# Patient Record
Sex: Male | Born: 1955
Health system: Southern US, Community
[De-identification: ages and names within clinical notes are randomized; demographics above are authoritative.]

## PROBLEM LIST (undated history)

## (undated) DIAGNOSIS — E119 Type 2 diabetes mellitus without complications: Secondary | ICD-10-CM

## (undated) DIAGNOSIS — Z889 Allergy status to unspecified drugs, medicaments and biological substances status: Secondary | ICD-10-CM

## (undated) DIAGNOSIS — I2510 Atherosclerotic heart disease of native coronary artery without angina pectoris: Secondary | ICD-10-CM

## (undated) DIAGNOSIS — E785 Hyperlipidemia, unspecified: Secondary | ICD-10-CM

## (undated) HISTORY — PX: KNEE SURGERY: SHX244

## (undated) HISTORY — PX: CORONARY ANGIOPLASTY WITH STENT PLACEMENT: SHX49

---

## 2008-06-26 ENCOUNTER — Ambulatory Visit: Payer: Self-pay | Admitting: Radiology

## 2008-06-26 ENCOUNTER — Ambulatory Visit (HOSPITAL_BASED_OUTPATIENT_CLINIC_OR_DEPARTMENT_OTHER): Admission: RE | Admit: 2008-06-26 | Discharge: 2008-06-26 | Payer: Self-pay | Admitting: Family Medicine

## 2012-10-02 ENCOUNTER — Encounter (HOSPITAL_BASED_OUTPATIENT_CLINIC_OR_DEPARTMENT_OTHER): Payer: Self-pay | Admitting: Emergency Medicine

## 2012-10-02 ENCOUNTER — Emergency Department (HOSPITAL_BASED_OUTPATIENT_CLINIC_OR_DEPARTMENT_OTHER)
Admission: EM | Admit: 2012-10-02 | Discharge: 2012-10-02 | Disposition: A | Discharge status: Home / Self Care | Payer: 59 | Attending: Emergency Medicine | Admitting: Emergency Medicine

## 2012-10-02 DIAGNOSIS — R220 Localized swelling, mass and lump, head: Secondary | ICD-10-CM | POA: Insufficient documentation

## 2012-10-02 DIAGNOSIS — R21 Rash and other nonspecific skin eruption: Secondary | ICD-10-CM | POA: Insufficient documentation

## 2012-10-02 DIAGNOSIS — R221 Localized swelling, mass and lump, neck: Secondary | ICD-10-CM | POA: Insufficient documentation

## 2012-10-02 DIAGNOSIS — Z87891 Personal history of nicotine dependence: Secondary | ICD-10-CM | POA: Insufficient documentation

## 2012-10-02 DIAGNOSIS — L509 Urticaria, unspecified: Secondary | ICD-10-CM | POA: Insufficient documentation

## 2012-10-02 DIAGNOSIS — T7840XA Allergy, unspecified, initial encounter: Secondary | ICD-10-CM

## 2012-10-02 DIAGNOSIS — Z79899 Other long term (current) drug therapy: Secondary | ICD-10-CM | POA: Insufficient documentation

## 2012-10-02 DIAGNOSIS — Z9861 Coronary angioplasty status: Secondary | ICD-10-CM | POA: Insufficient documentation

## 2012-10-02 HISTORY — DX: Allergy status to unspecified drugs, medicaments and biological substances: Z88.9

## 2012-10-02 MED ORDER — FAMOTIDINE IN NACL 20-0.9 MG/50ML-% IV SOLN
20.0000 mg | Freq: Once | INTRAVENOUS | Status: AC
  Administered 2012-10-02: 20 mg via INTRAVENOUS
  Filled 2012-10-02: qty 50

## 2012-10-02 MED ORDER — SODIUM CHLORIDE 0.9 % IV SOLN
INTRAVENOUS | Status: DC
  Administered 2012-10-02: 20:00:00 via INTRAVENOUS

## 2012-10-02 MED ORDER — FAMOTIDINE 20 MG PO TABS: 20.0000 mg | ORAL_TABLET | Freq: Two times a day (BID) | ORAL | Status: DC

## 2012-10-02 MED ORDER — DIPHENHYDRAMINE HCL 50 MG/ML IJ SOLN
50.0000 mg | Freq: Once | INTRAMUSCULAR | Status: AC
  Administered 2012-10-02: 50 mg via INTRAVENOUS
  Filled 2012-10-02: qty 1

## 2012-10-02 MED ORDER — METHYLPREDNISOLONE SODIUM SUCC 125 MG IJ SOLR
125.0000 mg | Freq: Once | INTRAMUSCULAR | Status: AC
  Administered 2012-10-02: 125 mg via INTRAVENOUS
  Filled 2012-10-02: qty 2

## 2012-10-02 MED ORDER — PREDNISONE 10 MG PO TABS: 20.0000 mg | ORAL_TABLET | Freq: Every day | ORAL | Status: DC

## 2012-10-02 NOTE — ED Notes (Signed)
MD at bedside. 

## 2012-10-02 NOTE — ED Notes (Signed)
Pt has daily problems with an unknown allergy and hives.  Today the swelling involves his mouth and tongue.  Sts allergist told him if it ever started to involve his mouth to come to ED.  Takes Benadryl and Zyrtec normally for sx but did not take it today. Has not taken any meds for allergies today.

## 2012-10-02 NOTE — ED Provider Notes (Signed)
CSN: [id number]     Arrival date & time 10/02/12  1853 History    This chart was scribed for Toy Baker, MD by Quintella Reichert, ED scribe.  This patient was seen in room MH01/MH01 and the patient's care was started at 7:33 PM.     Chief Complaint  Patient presents with  . Allergic Reaction    The history is provided by the patient. No language interpreter was used.    HPI Comments: Jeremiah Casey is a 57 y.o. male with chronic allergy problems of unknown etiology who presents to the Emergency Department complaining of moderate lip and tongue swelling.  Pt states that he has daily issues with generalized hives but has also had some mild swelling to his lips and tongue intermittently for several months.  Today he again developed lip and tongue swelling that has been more severe.  He also states that the back of his throat feel "funny."  He denies drooling, difficulty swallowing, wheezing, or SOB.  Pt is being evaluated by an allergist who advised him to come to the ED if his allergies spread to his mouth.  He normally medicates with Zyrtec and Benadryl but has not taken any today.  He also medicates regularly with Coreg.   Past Medical History  Diagnosis Date  . Multiple allergies     Past Surgical History  Procedure Laterality Date  . Coronary angioplasty with stent placement    . Knee surgery      No family history on file.   History  Substance Use Topics  . Smoking status: Former Games developer  . Smokeless tobacco: Not on file  . Alcohol Use: No     Review of Systems  HENT: Positive for facial swelling. Negative for drooling and trouble swallowing.   Respiratory: Negative for shortness of breath and wheezing.   Skin: Positive for rash.  All other systems reviewed and are negative.      Allergies  Review of patient's allergies indicates no known allergies.  Home Medications   Current Outpatient Rx  Name  Route  Sig  Dispense  Refill  . carvedilol (COREG) 12.5 MG  tablet   Oral   Take 12.5 mg by mouth 2 (two) times daily with a meal.          BP 132/82  Pulse 72  Temp(Src) 98.8 F (37.1 C) (Oral)  Resp 16  Ht 6\' 2"  (1.88 m)  Wt 235 lb (106.595 kg)  BMI 30.16 kg/m2  SpO2 100%  Physical Exam  Nursing note and vitals reviewed. Constitutional: He is oriented to person, place, and time. He appears well-developed and well-nourished.  Non-toxic appearance. No distress.  HENT:  Head: Normocephalic and atraumatic.  Mouth/Throat: Uvula is midline, oropharynx is clear and moist and mucous membranes are normal. No edematous. No oropharyngeal exudate, posterior oropharyngeal edema or posterior oropharyngeal erythema.  Bilateral lip edema, worse at the left lower lip.  No tongue edema. No uvula edema. Trachea midline.  Eyes: Conjunctivae, EOM and lids are normal. Pupils are equal, round, and reactive to light.  Neck: Normal range of motion. Neck supple. No tracheal deviation present. No mass present.  Cardiovascular: Normal rate, regular rhythm and normal heart sounds.  Exam reveals no gallop.   No murmur heard. Pulmonary/Chest: Effort normal and breath sounds normal. No stridor. No respiratory distress. He has no decreased breath sounds. He has no wheezes. He has no rhonchi. He has no rales.  Abdominal: Soft. Normal appearance and bowel sounds  are normal. He exhibits no distension. There is no tenderness. There is no rebound and no CVA tenderness.  Musculoskeletal: Normal range of motion. He exhibits no edema and no tenderness.  Neurological: He is alert and oriented to person, place, and time. He has normal strength. No cranial nerve deficit or sensory deficit. GCS eye subscore is 4. GCS verbal subscore is 5. GCS motor subscore is 6.  Skin: Skin is warm and dry. Rash noted. No abrasion noted.  Diffuse hives.  Psychiatric: He has a normal mood and affect. His speech is normal and behavior is normal.    ED Course  Procedures (including critical care  time)  DIAGNOSTIC STUDIES: Oxygen Saturation is 100% on room air, normal by my interpretation.    COORDINATION OF CARE: 7:38 PM-Discussed treatment plan which includes steroid treatment with pt at bedside and pt agreed to plan.    Labs Reviewed - No data to display No results found. No diagnosis found.  MDM  Patient given medications for allergic reaction here. The swelling has greatly improved. His known history of his type of reactions. No evidence of stridor. His airway is intact. Prescribe him corticosteroids and he was instructed to take Benadryl when necessary    I personally performed the services described in this documentation, which was scribed in my presence. The recorded information has been reviewed and is accurate.     Toy Baker, MD 10/02/12 [phone]

## 2014-06-03 ENCOUNTER — Emergency Department (HOSPITAL_BASED_OUTPATIENT_CLINIC_OR_DEPARTMENT_OTHER)
Admission: EM | Admit: 2014-06-03 | Discharge: 2014-06-03 | Disposition: A | Discharge status: Home / Self Care | Payer: 59 | Attending: Emergency Medicine | Admitting: Emergency Medicine

## 2014-06-03 ENCOUNTER — Encounter (HOSPITAL_BASED_OUTPATIENT_CLINIC_OR_DEPARTMENT_OTHER): Payer: Self-pay | Admitting: *Deleted

## 2014-06-03 DIAGNOSIS — S0502XA Injury of conjunctiva and corneal abrasion without foreign body, left eye, initial encounter: Secondary | ICD-10-CM | POA: Insufficient documentation

## 2014-06-03 DIAGNOSIS — Z87891 Personal history of nicotine dependence: Secondary | ICD-10-CM | POA: Insufficient documentation

## 2014-06-03 DIAGNOSIS — Y939 Activity, unspecified: Secondary | ICD-10-CM | POA: Diagnosis not present

## 2014-06-03 DIAGNOSIS — I2510 Atherosclerotic heart disease of native coronary artery without angina pectoris: Secondary | ICD-10-CM | POA: Diagnosis not present

## 2014-06-03 DIAGNOSIS — Z79899 Other long term (current) drug therapy: Secondary | ICD-10-CM | POA: Diagnosis not present

## 2014-06-03 DIAGNOSIS — Z9861 Coronary angioplasty status: Secondary | ICD-10-CM | POA: Diagnosis not present

## 2014-06-03 DIAGNOSIS — Y929 Unspecified place or not applicable: Secondary | ICD-10-CM | POA: Insufficient documentation

## 2014-06-03 DIAGNOSIS — S0592XA Unspecified injury of left eye and orbit, initial encounter: Secondary | ICD-10-CM | POA: Diagnosis present

## 2014-06-03 DIAGNOSIS — Y998 Other external cause status: Secondary | ICD-10-CM | POA: Diagnosis not present

## 2014-06-03 DIAGNOSIS — Z7982 Long term (current) use of aspirin: Secondary | ICD-10-CM | POA: Insufficient documentation

## 2014-06-03 DIAGNOSIS — X58XXXA Exposure to other specified factors, initial encounter: Secondary | ICD-10-CM | POA: Diagnosis not present

## 2014-06-03 HISTORY — DX: Atherosclerotic heart disease of native coronary artery without angina pectoris: I25.10

## 2014-06-03 MED ORDER — GENTAMICIN SULFATE 0.3 % OP SOLN: 2.0000 [drp] | Freq: Four times a day (QID) | OPHTHALMIC | Status: DC

## 2014-06-03 MED ORDER — FLUORESCEIN SODIUM 1 MG OP STRP
1.0000 | ORAL_STRIP | Freq: Once | OPHTHALMIC | Status: AC
  Administered 2014-06-03: 1 via OPHTHALMIC
  Filled 2014-06-03: qty 1

## 2014-06-03 MED ORDER — TETRACAINE HCL 0.5 % OP SOLN
2.0000 [drp] | Freq: Once | OPHTHALMIC | Status: AC
  Administered 2014-06-03: 2 [drp] via OPHTHALMIC
  Filled 2014-06-03: qty 2

## 2014-06-03 NOTE — ED Notes (Signed)
Pt denies any injury to Lt eye

## 2014-06-03 NOTE — ED Notes (Signed)
Pt ambulating independently w/ steady gait on d/c in no acute distress, A&Ox4. D/c instructions reviewed w/ pt and family - pt and family deny any further questions or concerns at present. Rx given x1  

## 2014-06-03 NOTE — Discharge Instructions (Signed)
Gentamicin eyedrops: 2 drops every 4 hours while awake.  Ibuprofen 600 mg every 6 hours as needed for pain.  Return to the emergency department if your symptoms significantly worsen or do not begin to improve in the next 24-48 hours.   Corneal Abrasion The cornea is the clear covering at the front and center of the eye. When looking at the colored portion of the eye (iris), you are looking through the cornea. This very thin tissue is made up of many layers. The surface layer is a single layer of cells (corneal epithelium) and is one of the most sensitive tissues in the body. If a scratch or injury causes the corneal epithelium to come off, it is called a corneal abrasion. If the injury extends to the tissues below the epithelium, the condition is called a corneal ulcer. CAUSES   Scratches.  Trauma.  Foreign body in the eye. Some people have recurrences of abrasions in the area of the original injury even after it has healed (recurrent erosion syndrome). Recurrent erosion syndrome generally improves and goes away with time. SYMPTOMS   Eye pain.  Difficulty or inability to keep the injured eye open.  The eye becomes very sensitive to light.  Recurrent erosions tend to happen suddenly, first thing in the morning, usually after waking up and opening the eye. DIAGNOSIS  Your health care provider can diagnose a corneal abrasion during an eye exam. Dye is usually placed in the eye using a drop or a small paper strip moistened by your tears. When the eye is examined with a special light, the abrasion shows up clearly because of the dye. TREATMENT   Small abrasions may be treated with antibiotic drops or ointment alone.  A pressure patch may be put over the eye. If this is done, follow your doctor's instructions for when to remove the patch. Do not drive or use machines while the eye patch is on. Judging distances is hard to do with a patch on. If the abrasion becomes infected and spreads to  the deeper tissues of the cornea, a corneal ulcer can result. This is serious because it can cause corneal scarring. Corneal scars interfere with light passing through the cornea and cause a loss of vision in the involved eye. HOME CARE INSTRUCTIONS  Use medicine or ointment as directed. Only take over-the-counter or prescription medicines for pain, discomfort, or fever as directed by your health care provider.  Do not drive or operate machinery if your eye is patched. Your ability to judge distances is impaired.  If your health care provider has given you a follow-up appointment, it is very important to keep that appointment. Not keeping the appointment could result in a severe eye infection or permanent loss of vision. If there is any problem keeping the appointment, let your health care provider know. SEEK MEDICAL CARE IF:   You have pain, light sensitivity, and a scratchy feeling in one eye or both eyes.  Your pressure patch keeps loosening up, and you can blink your eye under the patch after treatment.  Any kind of discharge develops from the eye after treatment or if the lids stick together in the morning.  You have the same symptoms in the morning as you did with the original abrasion days, weeks, or months after the abrasion healed. MAKE SURE YOU:   Understand these instructions.  Will watch your condition.  Will get help right away if you are not doing well or get worse. Document Released: 02/07/2000  Document Revised: 02/14/2013 Document Reviewed: 10/17/2012 [facility] Patient Information 2015 [place], [place]. This information is not intended to replace advice given to you by your health care provider. Make sure you discuss any questions you have with your health care provider.

## 2014-06-03 NOTE — ED Notes (Signed)
Pt reports fb sensation left eye, eye redness, watering x 1 day

## 2014-06-03 NOTE — ED Notes (Signed)
Presents to ED today w/ c/o lt eye redness and swelling, also noted to be very watery, pt states having some blurred vision out of left eye also.

## 2014-06-03 NOTE — ED Notes (Signed)
MD at bedside. 

## 2014-06-04 NOTE — ED Provider Notes (Signed)
CSN: [id number]     Arrival date & time 06/03/14  1326 History   First MD Initiated Contact with Patient 06/03/14 1344     Chief Complaint  Patient presents with  . Eye Problem     (Consider location/radiation/quality/duration/timing/severity/associated sxs/prior Treatment) HPI Comments: Patient is a 59 year old male who presents with complaints of left eye irritation, redness, and drainage for the past 24 hours. He denies any injury or trauma. He is not a contact lens user. He reports no visual disturbances. He feels as though there may be something under his eyelid.  Patient is a 59 y.o. male presenting with eye problem. The history is provided by the patient.  Eye Problem Location:  L eye Quality:  Foreign body sensation Severity:  Moderate Onset quality:  Sudden Duration:  1 day Timing:  Constant Progression:  Worsening Chronicity:  New Relieved by:  Nothing Worsened by:  Nothing tried   Past Medical History  Diagnosis Date  . Multiple allergies   . Coronary artery disease    Past Surgical History  Procedure Laterality Date  . Coronary angioplasty with stent placement    . Knee surgery     No family history on file. History  Substance Use Topics  . Smoking status: Former Research scientist (life sciences)  . Smokeless tobacco: Never Used  . Alcohol Use: No    Review of Systems  All other systems reviewed and are negative.     Allergies  Review of patient's allergies indicates no known allergies.  Home Medications   Prior to Admission medications   Medication Sig Start Date End Date Taking? Authorizing Provider  aspirin 81 MG tablet Take 81 mg by mouth daily.   Yes Historical Provider, MD  carvedilol (COREG) 12.5 MG tablet Take 12.5 mg by mouth 2 (two) times daily with a meal.   Yes Historical Provider, MD  famotidine (PEPCID) 20 MG tablet Take 1 tablet (20 mg total) by mouth 2 (two) times daily. 10/02/12   Lacretia Leigh, MD  gentamicin (GARAMYCIN) 0.3 % ophthalmic solution Place 2  drops into the left eye 4 (four) times daily. 06/03/14   Veryl Speak, MD  predniSONE (DELTASONE) 10 MG tablet Take 2 tablets (20 mg total) by mouth daily. 10/02/12   Lacretia Leigh, MD   BP 159/80 mmHg  Pulse 72  Temp(Src) 98.1 F (36.7 C) (Oral)  Resp 18  Ht 6\' 2"  (1.88 m)  Wt 240 lb (108.863 kg)  BMI 30.80 kg/m2  SpO2 97% Physical Exam  Constitutional: He is oriented to person, place, and time. He appears well-developed and well-nourished. No distress.  HENT:  Head: Normocephalic and atraumatic.  Eyes: EOM are normal. Pupils are equal, round, and reactive to light.  The left cornea is noted to have an abrasion approximately 8:00 with fluorescein staining. The conjunctiva is injected. There is no foreign body under the lid that I am able to identify.  Neck: Normal range of motion. Neck supple.  Neurological: He is alert and oriented to person, place, and time.  Skin: Skin is warm and dry. He is not diaphoretic.  Nursing note and vitals reviewed.   ED Course  Procedures (including critical care time) Labs Review Labs Reviewed - No data to display  Imaging Review No results found.   EKG Interpretation None      MDM   Final diagnoses:  Corneal abrasion, left, initial encounter    We'll treat with gentamicin and when necessary return.    Veryl Speak, MD 06/04/14 [phone]

## 2016-02-05 ENCOUNTER — Emergency Department (HOSPITAL_BASED_OUTPATIENT_CLINIC_OR_DEPARTMENT_OTHER): Payer: 59

## 2016-02-05 ENCOUNTER — Encounter (HOSPITAL_BASED_OUTPATIENT_CLINIC_OR_DEPARTMENT_OTHER): Payer: Self-pay | Admitting: Emergency Medicine

## 2016-02-05 ENCOUNTER — Emergency Department (HOSPITAL_BASED_OUTPATIENT_CLINIC_OR_DEPARTMENT_OTHER)
Admission: EM | Admit: 2016-02-05 | Discharge: 2016-02-05 | Disposition: A | Discharge status: Home / Self Care | Payer: 59 | Attending: Emergency Medicine | Admitting: Emergency Medicine

## 2016-02-05 DIAGNOSIS — Z87891 Personal history of nicotine dependence: Secondary | ICD-10-CM | POA: Diagnosis not present

## 2016-02-05 DIAGNOSIS — J189 Pneumonia, unspecified organism: Secondary | ICD-10-CM

## 2016-02-05 DIAGNOSIS — J181 Lobar pneumonia, unspecified organism: Secondary | ICD-10-CM | POA: Insufficient documentation

## 2016-02-05 DIAGNOSIS — Z7984 Long term (current) use of oral hypoglycemic drugs: Secondary | ICD-10-CM | POA: Diagnosis not present

## 2016-02-05 DIAGNOSIS — R05 Cough: Secondary | ICD-10-CM | POA: Diagnosis present

## 2016-02-05 DIAGNOSIS — E119 Type 2 diabetes mellitus without complications: Secondary | ICD-10-CM | POA: Insufficient documentation

## 2016-02-05 DIAGNOSIS — Z79899 Other long term (current) drug therapy: Secondary | ICD-10-CM | POA: Insufficient documentation

## 2016-02-05 DIAGNOSIS — Z7982 Long term (current) use of aspirin: Secondary | ICD-10-CM | POA: Diagnosis not present

## 2016-02-05 DIAGNOSIS — I2510 Atherosclerotic heart disease of native coronary artery without angina pectoris: Secondary | ICD-10-CM | POA: Insufficient documentation

## 2016-02-05 HISTORY — DX: Type 2 diabetes mellitus without complications: E11.9

## 2016-02-05 HISTORY — DX: Hyperlipidemia, unspecified: E78.5

## 2016-02-05 LAB — BASIC METABOLIC PANEL
Anion gap: 10 (ref 5–15)
BUN: 19 mg/dL (ref 6–20)
CHLORIDE: 105 mmol/L (ref 101–111)
CO2: 24 mmol/L (ref 22–32)
CREATININE: 1.31 mg/dL — AB (ref 0.61–1.24)
Calcium: 9.1 mg/dL (ref 8.9–10.3)
GFR calc Af Amer: >60 mL/min (ref >60)
GFR, EST NON AFRICAN AMERICAN: 58 mL/min — AB (ref >60)
Glucose, Bld: 94 mg/dL (ref 65–99)
Potassium: 4.3 mmol/L (ref 3.5–5.1)
SODIUM: 139 mmol/L (ref 135–145)

## 2016-02-05 MED ORDER — GUAIFENESIN-CODEINE 100-10 MG/5ML PO SYRP: 5.0000 mL | ORAL_SOLUTION | Freq: Three times a day (TID) | ORAL | 0 refills | Status: DC | PRN

## 2016-02-05 MED ORDER — LEVOFLOXACIN 750 MG PO TABS: 750.0000 mg | ORAL_TABLET | Freq: Every day | ORAL | 0 refills | Status: AC

## 2016-02-05 MED ORDER — ALBUTEROL SULFATE HFA 108 (90 BASE) MCG/ACT IN AERS
2.0000 | INHALATION_SPRAY | Freq: Four times a day (QID) | RESPIRATORY_TRACT | Status: DC | PRN
  Administered 2016-02-05: 2 via RESPIRATORY_TRACT
  Filled 2016-02-05: qty 6.7

## 2016-02-05 MED ORDER — IOPAMIDOL (ISOVUE-370) INJECTION 76%
100.0000 mL | Freq: Once | INTRAVENOUS | Status: AC | PRN
  Administered 2016-02-05: 100 mL via INTRAVENOUS

## 2016-02-05 MED ORDER — SODIUM CHLORIDE 0.9 % IV BOLUS (SEPSIS): 500.0000 mL | Freq: Once | INTRAVENOUS | Status: DC

## 2016-02-05 NOTE — Discharge Instructions (Signed)
Read the information below.  Your CT scan shows a possible pneumonia in the right lung. You are being treated with antibiotics.  Your kidney function is slightly declined, please be sure to drink plenty of fluids.  You can take tylenol for pain relief.  I have provided an inhaler for relief of any wheezing or shortness of breath.  The CT scan also showed a possible soft tissue mass in the center of your chest, it is important that you follow up with your primary doctor - you may need an esophagram to further evaluation.  Use the prescribed medication as directed.  Please discuss all new medications with your pharmacist.   You may return to the Emergency Department at any time for worsening condition or any new symptoms that concern you.

## 2016-02-05 NOTE — ED Notes (Signed)
ED Provider at bedside. 

## 2016-02-05 NOTE — ED Triage Notes (Signed)
Patient reports cough for >1 week. Patient states he was sen by the nurse at work yesterday and started on tessalon and promethazine without relief. Patient states cough is dry.

## 2016-02-05 NOTE — ED Provider Notes (Signed)
[facility] Provider Note   CSN: [id number] Arrival date & time: 02/05/16  1623  By signing my name below, I, Emmanuella Mensah, attest that this documentation has been prepared under the direction and in the presence of Gay Filler, PA-C. Electronically Signed: Judithann Sauger, ED Scribe. 02/05/16. 5:04 PM.   History   Chief Complaint Chief Complaint  Patient presents with  . Cough   HPI Comments: Jeremiah Casey is a 60 y.o. male with a hx of DM, CAD, and Hyperlipemia who presents to the Emergency Department complaining of gradually worsening persistent moderate non-productive cough onset one week ago. He reports associated generalized headache and a hoarse voice due to the cough. He adds that he has been experiencing intermittent wheezing. He states that he had sore throat at the onset of his symptoms but that has resolved on its own. No alleviating factors noted. He denies any known sick contacts. He states that he was evaluated by a nurse at work yesterday and was prescribed tessalon and promethazine however his symptoms are still persisting despite treatment. He reports that he uses an inhaler PRN but denies a hx of asthma. Pt has NKDA. He denies any recent long car/plane trips, immobilizations, or surgeries. No h/o cancer. He reports a hx of PE several years where he was placed on anticoagulants secondary to a knee surgery; however, he is no longer on the anticoagulants. He denies any fever, chills, congestion, post nasal drip, rhinorrhea, teary eyes, sinus pressure, ear pain, nausea, vomiting, urinary symptoms, generalized rash, CP, SOB, lower extremity swelling/pain, hemoptysis, syncope, or any other symptoms.   The history is provided by the patient. No language interpreter was used.    Past Medical History:  Diagnosis Date  . Coronary artery disease   . Diabetes mellitus without complication ([place])   . Hyperlipemia   . Multiple allergies     There are no active  problems to display for this patient.   Past Surgical History:  Procedure Laterality Date  . CORONARY ANGIOPLASTY WITH STENT PLACEMENT    . KNEE SURGERY         Home Medications    Prior to Admission medications   Medication Sig Start Date End Date Taking? Authorizing Provider  aspirin 81 MG tablet Take 81 mg by mouth daily.   Yes Historical Provider, MD  atorvastatin (LIPITOR) 40 MG tablet Take 40 mg by mouth daily.   Yes Historical Provider, MD  benzonatate (TESSALON) 100 MG capsule Take 100 mg by mouth 3 (three) times daily as needed for cough.   Yes Historical Provider, MD  doxazosin (CARDURA) 2 MG tablet Take 2 mg by mouth daily.   Yes Historical Provider, MD  metFORMIN (GLUCOPHAGE) 500 MG tablet Take by mouth 2 (two) times daily with a meal.   Yes Historical Provider, MD  metoprolol tartrate (LOPRESSOR) 25 MG tablet Take 25 mg by mouth 2 (two) times daily.   Yes Historical Provider, MD  promethazine-dextromethorphan (PROMETHAZINE-DM) 6.25-15 MG/5ML syrup Take 5 mLs by mouth 4 (four) times daily as needed for cough.   Yes Historical Provider, MD  carvedilol (COREG) 12.5 MG tablet Take 12.5 mg by mouth 2 (two) times daily with a meal.    Historical Provider, MD  famotidine (PEPCID) 20 MG tablet Take 1 tablet (20 mg total) by mouth 2 (two) times daily. 10/02/12   Lacretia Leigh, MD  gentamicin (GARAMYCIN) 0.3 % ophthalmic solution Place 2 drops into the left eye 4 (four) times daily. 06/03/14   Nathaneil Canary  Del[place], MD  guaiFENesin-codeine (CHERATUSSIN AC) 100-10 MG/5ML syrup Take 5 mLs by mouth 3 (three) times daily as needed for cough. 02/05/16   Roxanna Mew, PA-C  levofloxacin (LEVAQUIN) 750 MG tablet Take 1 tablet (750 mg total) by mouth daily. 02/05/16 02/10/16  Roxanna Mew, PA-C  predniSONE (DELTASONE) 10 MG tablet Take 2 tablets (20 mg total) by mouth daily. 10/02/12   Lacretia Leigh, MD    Family History History reviewed. No pertinent family history.  Social  History Social History  Substance Use Topics  . Smoking status: Former Research scientist (life sciences)  . Smokeless tobacco: Never Used  . Alcohol use No     Allergies   Patient has no known allergies.   Review of Systems Review of Systems  Constitutional: Negative for chills and fever.  HENT: Positive for voice change. Negative for congestion, ear pain, postnasal drip, sinus pressure and sore throat.   Eyes: Negative for discharge.  Respiratory: Positive for cough and wheezing. Negative for shortness of breath.   Cardiovascular: Negative for chest pain and leg swelling.  Gastrointestinal: Negative for abdominal pain, nausea and vomiting.  Genitourinary: Negative for dysuria and hematuria.  Musculoskeletal: Negative for neck pain.  Skin: Negative for rash.  Neurological: Positive for headaches. Negative for syncope.     Physical Exam Updated Vital Signs BP 117/77 (BP Location: Right Arm)   Pulse 92   Temp 99 F (37.2 C) (Oral)   Resp 22   Ht 6\' 2"  (1.88 m)   Wt 107.3 kg   SpO2 97%   BMI 30.37 kg/m   Physical Exam  Constitutional: He is oriented to person, place, and time. He appears well-developed and well-nourished. No distress.  HENT:  Head: Normocephalic and atraumatic.  Mouth/Throat: Uvula is midline, oropharynx is clear and moist and mucous membranes are normal. No trismus in the jaw. No oropharyngeal exudate. No tonsillar exudate.  No trismus. No posterior oropharynx erythema. No tonsillar hypertrophy or exudate.  Eyes: Conjunctivae and EOM are normal. Pupils are equal, round, and reactive to light. Right eye exhibits no discharge. Left eye exhibits no discharge. No scleral icterus.  Neck: Normal range of motion and phonation normal. Neck supple. No neck rigidity. No tracheal deviation and normal range of motion present.  No nuchal rigidity.   Cardiovascular: Normal rate, regular rhythm, normal heart sounds and intact distal pulses.   No murmur heard. Pulmonary/Chest: Effort normal  and breath sounds normal. No stridor. No respiratory distress. He has no wheezes. He has no rales.  Abdominal: Soft. Bowel sounds are normal. He exhibits no distension. There is no tenderness. There is no rigidity, no rebound, no guarding and no CVA tenderness.  Musculoskeletal: Normal range of motion.  No posterior calf tenderness; no BLE swelling  Lymphadenopathy:    He has no cervical adenopathy.  Neurological: He is alert and oriented to person, place, and time. He is not disoriented. Coordination and gait normal. GCS eye subscore is 4. GCS verbal subscore is 5. GCS motor subscore is 6.  Patient moves all extremities freely.   Skin: Skin is warm and dry. He is not diaphoretic.  Psychiatric: He has a normal mood and affect. His behavior is normal.  Nursing note and vitals reviewed.    ED Treatments / Results  DIAGNOSTIC STUDIES: Oxygen Saturation is 97% on RA, normal by my interpretation.    COORDINATION OF CARE: 5:01 PM- Pt advised of plan for treatment and pt agrees. Pt will receive chest x-ray for further evaluation.  Labs (all labs ordered are listed, but only abnormal results are displayed) Labs Reviewed  BASIC METABOLIC PANEL - Abnormal; Notable for the following:       Result Value   Creatinine, Ser 1.31 (*)    GFR calc non Af Amer 58 (*)    All other components within normal limits    EKG  EKG Interpretation None       Radiology Dg Chest 2 View  Result Date: 02/05/2016 CLINICAL DATA:  Cough EXAM: CHEST  2 VIEW COMPARISON:  None. FINDINGS: The heart size and mediastinal contours are within normal limits. Both lungs are clear. The visualized skeletal structures are unremarkable. IMPRESSION: No active cardiopulmonary disease. Electronically Signed   By: Kathreen Devoid   On: 02/05/2016 17:18   Ct Angio Chest Pe W/cm &/or Wo Cm  Result Date: 02/05/2016 CLINICAL DATA:  Evaluation for acute cough and chest pain for 1 week, wheezing. History of DVT, PE. EXAM: CT  ANGIOGRAPHY CHEST WITH CONTRAST TECHNIQUE: Multidetector CT imaging of the chest was performed using the standard protocol during bolus administration of intravenous contrast. Multiplanar CT image reconstructions and MIPs were obtained to evaluate the vascular anatomy. CONTRAST:  100 cc of Isovue 370. COMPARISON:  Prior radiograph from earlier the same day. FINDINGS: Cardiovascular: Intrathoracic aorta of normal caliber and appearance without acute abnormality. No aneurysm. Visualized great vessels within normal limits. Mild plaque within the arch itself. Heart size at upper limits of normal. Three vessel coronary artery calcifications noted. No pericardial effusion. Pulmonary arteries adequately opacified for evaluation. Main pulmonary artery measures within normal limits for size at 3 cm in diameter. No filling defect to suggest acute pulmonary embolism. Re-formatted imaging confirms these findings. Mediastinum/Nodes: No pathologically enlarged mediastinal, hilar, or axillary lymph nodes identified. Soft tissue density adjacent to the upper esophagus, posterior to the trachea noted, indeterminate, but may reflect an esophageal diverticulum (series 4, image 16). Possible posterior mediastinal mass not entirely excluded. No adjacent osseous abnormality. Lesion measures approximately 4.0 x 2.3 cm, although exact measurements somewhat difficult due to poor contrast opacification and/or timing of the contrast bolus. Esophagus otherwise unremarkable. Lungs/Pleura: Lungs normally inflated. Mild atelectatic changes present dependently within the lower lobes, left greater than right. No pulmonary edema or pleural effusion. There is a few scattered small foci of ground-glass opacity within the posterior right upper lobe, concerning for pneumonia (series 5, image 51). No other focal infiltrates identified. No other worrisome pulmonary nodule or mass. Upper Abdomen:  Visualized upper abdomen is within normal limits.  Musculoskeletal: No acute osseous abnormality. No worrisome lytic or blastic osseous lesions. Review of the MIP images confirms the above findings. IMPRESSION: 1. No CT evidence for acute pulmonary embolism. 2. Patchy ground-glass opacity within the inferior right lower lobe, concerning for mild infection/pneumonia given the history of cough. 3. Abnormal soft tissue density within the upper mediastinum as above. Finding is of uncertain etiology, but is positioned adjacent to the esophagus, and may reflect a collapsed esophageal diverticulum. Further evaluation with dedicated esophagram is suggested for further evaluation. Possible posterior mediastinal mass could also be considered. 4. Diffuse 3 vessel coronary artery calcifications. Electronically Signed   By: Jeannine Boga M.D.   On: 02/05/2016 20:23    Procedures Procedures (including critical care time)  Medications Ordered in ED Medications  iopamidol (ISOVUE-370) 76 % injection 100 mL (100 mLs Intravenous Contrast Given 02/05/16 1939)     Initial Impression / Assessment and Plan / ED Course  Gay Filler, PA-C has  reviewed the triage vital signs and the nursing notes.  Pertinent labs & imaging results that were available during my care of the patient were reviewed by me and considered in my medical decision making (see chart for details).  Clinical Course as of Feb 06 32  Wed Feb 05, 2016  1736 DG Chest 2 View [AM]  2040 CT Angio Chest PE W/Cm &/Or Wo Cm [AM]  Thu Feb 06, 2016  0026 CT Angio Chest PE W/Cm &/Or Wo Cm [AM]    Clinical Course User Index [AM] Roxanna Mew, PA-C   Vitals:   02/05/16 1907 02/05/16 1910  BP:  126/70  Pulse: 97 76  Resp: 20 20  Temp:      Patient presents to ED with complaint of cough x 1 week. Patient is afebrile and non-toxic appearing in NAD. Initial vitals remarkable for tachypnea, otherwise stable. No hypoxia. Lungs CTABL. Heart RRR. Abdomen soft, non-tender, non-distended. Pt  ambulatory.  Pt CXR negative for acute infiltrate, effusion, or PTX. Given h/o PE, not on anti-coagulation, and current cough CTA ordered - no pulmonary embolus, right PNA present, of incidental finding is a soft tissue density in mediastinum - ?esophageal diverticulum vs. ?mass. Mild bump in creatinine - ?dehydration secondary to infection. Discussed results and plan with pt. Rx ABX for PNA, given h/o DM levofloxacin prescribed. Rx cheratussin for cough relief at night. Inhaler provided for wheezing. Discussed importance of staying hydrated. Encouraged follow up with PCP within one week for re-evaluation as well as further assessment of mediastinal density. Return precautions given. Pt voiced understanding and is agreeable.    Final Clinical Impressions(s) / ED Diagnoses   Final diagnoses:  Community acquired pneumonia of right lung, unspecified part of lung    New Prescriptions Discharge Medication List as of 02/05/2016  8:43 PM    START taking these medications   Details  guaiFENesin-codeine (CHERATUSSIN AC) 100-10 MG/5ML syrup Take 5 mLs by mouth 3 (three) times daily as needed for cough., Starting Wed 02/05/2016, Print    levofloxacin (LEVAQUIN) 750 MG tablet Take 1 tablet (750 mg total) by mouth daily., Starting Wed 02/05/2016, Until Mon 02/10/2016, Print       I personally performed the services described in this documentation, which was scribed in my presence. The recorded information has been reviewed and is accurate.    Roxanna Mew, PA-C 02/06/16 [place], DO 02/06/16 [phone]

## 2016-02-05 NOTE — ED Notes (Signed)
Patient transported to CT 

## 2016-03-03 DIAGNOSIS — J189 Pneumonia, unspecified organism: Secondary | ICD-10-CM | POA: Diagnosis not present

## 2016-03-03 DIAGNOSIS — Z0000 Encounter for general adult medical examination without abnormal findings: Secondary | ICD-10-CM | POA: Diagnosis not present

## 2016-03-03 DIAGNOSIS — E1165 Type 2 diabetes mellitus with hyperglycemia: Secondary | ICD-10-CM | POA: Diagnosis not present

## 2016-03-03 DIAGNOSIS — E559 Vitamin D deficiency, unspecified: Secondary | ICD-10-CM | POA: Diagnosis not present

## 2016-06-02 DIAGNOSIS — E1165 Type 2 diabetes mellitus with hyperglycemia: Secondary | ICD-10-CM | POA: Diagnosis not present

## 2016-06-02 DIAGNOSIS — Z79899 Other long term (current) drug therapy: Secondary | ICD-10-CM | POA: Diagnosis not present

## 2016-06-02 DIAGNOSIS — I10 Essential (primary) hypertension: Secondary | ICD-10-CM | POA: Diagnosis not present

## 2016-06-02 DIAGNOSIS — E782 Mixed hyperlipidemia: Secondary | ICD-10-CM | POA: Diagnosis not present

## 2016-09-04 DIAGNOSIS — E782 Mixed hyperlipidemia: Secondary | ICD-10-CM | POA: Diagnosis not present

## 2016-09-04 DIAGNOSIS — E559 Vitamin D deficiency, unspecified: Secondary | ICD-10-CM | POA: Diagnosis not present

## 2016-09-04 DIAGNOSIS — E1165 Type 2 diabetes mellitus with hyperglycemia: Secondary | ICD-10-CM | POA: Diagnosis not present

## 2016-09-04 DIAGNOSIS — Z79899 Other long term (current) drug therapy: Secondary | ICD-10-CM | POA: Diagnosis not present

## 2016-10-14 DIAGNOSIS — G4733 Obstructive sleep apnea (adult) (pediatric): Secondary | ICD-10-CM | POA: Diagnosis not present

## 2016-10-14 DIAGNOSIS — Z96659 Presence of unspecified artificial knee joint: Secondary | ICD-10-CM | POA: Diagnosis not present

## 2016-10-14 DIAGNOSIS — M25512 Pain in left shoulder: Secondary | ICD-10-CM | POA: Diagnosis not present

## 2016-10-14 DIAGNOSIS — M19012 Primary osteoarthritis, left shoulder: Secondary | ICD-10-CM | POA: Diagnosis not present

## 2016-10-14 DIAGNOSIS — M7542 Impingement syndrome of left shoulder: Secondary | ICD-10-CM | POA: Diagnosis not present

## 2016-11-27 DIAGNOSIS — Z23 Encounter for immunization: Secondary | ICD-10-CM | POA: Diagnosis not present

## 2017-03-11 DIAGNOSIS — E782 Mixed hyperlipidemia: Secondary | ICD-10-CM | POA: Diagnosis not present

## 2017-03-11 DIAGNOSIS — E1165 Type 2 diabetes mellitus with hyperglycemia: Secondary | ICD-10-CM | POA: Diagnosis not present

## 2017-03-11 DIAGNOSIS — Z79899 Other long term (current) drug therapy: Secondary | ICD-10-CM | POA: Diagnosis not present

## 2017-03-11 DIAGNOSIS — R0981 Nasal congestion: Secondary | ICD-10-CM | POA: Diagnosis not present

## 2017-03-11 DIAGNOSIS — E559 Vitamin D deficiency, unspecified: Secondary | ICD-10-CM | POA: Diagnosis not present

## 2017-03-25 DIAGNOSIS — M545 Low back pain: Secondary | ICD-10-CM | POA: Diagnosis not present

## 2017-03-25 DIAGNOSIS — M5416 Radiculopathy, lumbar region: Secondary | ICD-10-CM | POA: Diagnosis not present

## 2017-03-25 DIAGNOSIS — M5440 Lumbago with sciatica, unspecified side: Secondary | ICD-10-CM | POA: Diagnosis not present

## 2017-03-29 DIAGNOSIS — Z955 Presence of coronary angioplasty implant and graft: Secondary | ICD-10-CM | POA: Diagnosis not present

## 2017-03-29 DIAGNOSIS — I2510 Atherosclerotic heart disease of native coronary artery without angina pectoris: Secondary | ICD-10-CM | POA: Diagnosis not present

## 2017-03-29 DIAGNOSIS — I10 Essential (primary) hypertension: Secondary | ICD-10-CM | POA: Diagnosis not present

## 2017-04-15 ENCOUNTER — Encounter (HOSPITAL_BASED_OUTPATIENT_CLINIC_OR_DEPARTMENT_OTHER): Payer: Self-pay | Admitting: Emergency Medicine

## 2017-04-15 ENCOUNTER — Other Ambulatory Visit: Payer: Self-pay

## 2017-04-15 ENCOUNTER — Emergency Department (HOSPITAL_BASED_OUTPATIENT_CLINIC_OR_DEPARTMENT_OTHER)
Admission: EM | Admit: 2017-04-15 | Discharge: 2017-04-15 | Disposition: A | Discharge status: Home / Self Care | Payer: 59 | Attending: Emergency Medicine | Admitting: Emergency Medicine

## 2017-04-15 DIAGNOSIS — R05 Cough: Secondary | ICD-10-CM | POA: Insufficient documentation

## 2017-04-15 DIAGNOSIS — Z5321 Procedure and treatment not carried out due to patient leaving prior to being seen by health care provider: Secondary | ICD-10-CM | POA: Insufficient documentation

## 2017-04-15 NOTE — ED Triage Notes (Signed)
Patient states that he has felt fatigued over the last week and he also has a cough. The patient also reports that he is having some nasal congestion

## 2017-04-16 NOTE — ED Notes (Signed)
04/16/2017 Attempted follow up call.  Phone rang busy.

## 2017-05-17 DIAGNOSIS — M5416 Radiculopathy, lumbar region: Secondary | ICD-10-CM | POA: Diagnosis not present

## 2017-06-03 DIAGNOSIS — M5416 Radiculopathy, lumbar region: Secondary | ICD-10-CM | POA: Diagnosis not present

## 2017-06-08 DIAGNOSIS — H35361 Drusen (degenerative) of macula, right eye: Secondary | ICD-10-CM | POA: Diagnosis not present

## 2017-06-08 DIAGNOSIS — M5416 Radiculopathy, lumbar region: Secondary | ICD-10-CM | POA: Diagnosis not present

## 2017-08-18 ENCOUNTER — Other Ambulatory Visit: Payer: Self-pay

## 2017-08-18 ENCOUNTER — Emergency Department (HOSPITAL_BASED_OUTPATIENT_CLINIC_OR_DEPARTMENT_OTHER)
Admission: EM | Admit: 2017-08-18 | Discharge: 2017-08-18 | Disposition: A | Discharge status: Home / Self Care | Payer: 59 | Attending: Emergency Medicine | Admitting: Emergency Medicine

## 2017-08-18 ENCOUNTER — Encounter (HOSPITAL_BASED_OUTPATIENT_CLINIC_OR_DEPARTMENT_OTHER): Payer: Self-pay | Admitting: Emergency Medicine

## 2017-08-18 ENCOUNTER — Emergency Department (HOSPITAL_BASED_OUTPATIENT_CLINIC_OR_DEPARTMENT_OTHER): Payer: 59

## 2017-08-18 DIAGNOSIS — Z79899 Other long term (current) drug therapy: Secondary | ICD-10-CM | POA: Insufficient documentation

## 2017-08-18 DIAGNOSIS — M25552 Pain in left hip: Secondary | ICD-10-CM

## 2017-08-18 DIAGNOSIS — I2510 Atherosclerotic heart disease of native coronary artery without angina pectoris: Secondary | ICD-10-CM | POA: Diagnosis not present

## 2017-08-18 DIAGNOSIS — Z7982 Long term (current) use of aspirin: Secondary | ICD-10-CM | POA: Insufficient documentation

## 2017-08-18 DIAGNOSIS — Z7984 Long term (current) use of oral hypoglycemic drugs: Secondary | ICD-10-CM | POA: Diagnosis not present

## 2017-08-18 DIAGNOSIS — E119 Type 2 diabetes mellitus without complications: Secondary | ICD-10-CM | POA: Insufficient documentation

## 2017-08-18 DIAGNOSIS — Z87891 Personal history of nicotine dependence: Secondary | ICD-10-CM | POA: Diagnosis not present

## 2017-08-18 DIAGNOSIS — M25852 Other specified joint disorders, left hip: Secondary | ICD-10-CM | POA: Diagnosis not present

## 2017-08-18 DIAGNOSIS — Z955 Presence of coronary angioplasty implant and graft: Secondary | ICD-10-CM | POA: Diagnosis not present

## 2017-08-18 MED ORDER — IBUPROFEN 600 MG PO TABS: 600.0000 mg | ORAL_TABLET | Freq: Four times a day (QID) | ORAL | 0 refills | Status: DC | PRN

## 2017-08-18 MED ORDER — OXYCODONE-ACETAMINOPHEN 5-325 MG PO TABS
1.0000 | ORAL_TABLET | Freq: Once | ORAL | Status: AC
  Administered 2017-08-18: 1 via ORAL
  Filled 2017-08-18: qty 1

## 2017-08-18 MED ORDER — HYDROCODONE-ACETAMINOPHEN 5-325 MG PO TABS: 1.0000 | ORAL_TABLET | Freq: Four times a day (QID) | ORAL | 0 refills | Status: DC | PRN

## 2017-08-18 MED ORDER — KETOROLAC TROMETHAMINE 15 MG/ML IJ SOLN
15.0000 mg | Freq: Once | INTRAMUSCULAR | Status: AC
  Administered 2017-08-18: 15 mg via INTRAMUSCULAR
  Filled 2017-08-18: qty 1

## 2017-08-18 NOTE — Discharge Instructions (Addendum)
You were seen today for left hip pain.  You have some changes on her x-ray which may reflect femoral acetabular impingement.  Follow-up closely with your orthopedist for definitive management.

## 2017-08-18 NOTE — ED Notes (Signed)
Patient transported to X-ray 

## 2017-08-18 NOTE — ED Triage Notes (Addendum)
Left hip pain since Tuesday am. Denies recent injury. Has been taking Aleve without relief and using heat . 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

## 2017-08-18 NOTE — ED Provider Notes (Signed)
[facility] EMERGENCY DEPARTMENT Provider Note   CSN: [id number] Arrival date & time: 08/18/17  0241     History   Chief Complaint Chief Complaint  Patient presents with  . Hip Pain    HPI Rollie Hynek is a 62 y.o. male.  HPI  This is a 62 year old male with a history of coronary artery disease, diabetes, hyperlipidemia who presents with left hip pain.  Patient reports worsening left hip pain since Monday night to bed Monday night with "some aching in the left hip."  Since that time he has had worsening pain.  Pain is worse with ambulation.  Patient took Aleve and used heat with minimal relief.  He has had similar pain in the past on the right hip.  He denies any numbness or tingling in the leg.  He describes the pain is achy and nonradiating.  Current pain is 8 out of 10.  Currently has an orthopedist as he previously has had bilateral knee replacements.  He denies any fevers or infectious symptoms.  Past Medical History:  Diagnosis Date  . Coronary artery disease   . Diabetes mellitus without complication ([place])   . Hyperlipemia   . Multiple allergies     There are no active problems to display for this patient.   Past Surgical History:  Procedure Laterality Date  . CORONARY ANGIOPLASTY WITH STENT PLACEMENT    . KNEE SURGERY          Home Medications    Prior to Admission medications   Medication Sig Start Date End Date Taking? Authorizing Provider  aspirin 81 MG tablet Take 81 mg by mouth daily.   Yes [provider]  Canagliflozin-metFORMIN HCl (INVOKAMET) 150-500 MG TABS Take by mouth.   Yes [provider]  carvedilol (COREG) 12.5 MG tablet Take 12.5 mg by mouth 2 (two) times daily with a meal.   Yes [provider]  doxazosin (CARDURA) 2 MG tablet  04/04/14  Yes [provider]  metFORMIN (GLUCOPHAGE) 500 MG tablet Take by mouth 2 (two) times daily with a meal.   Yes [provider]  metoprolol tartrate  (LOPRESSOR) 25 MG tablet Take 25 mg by mouth 2 (two) times daily.   Yes [provider]  atorvastatin (LIPITOR) 40 MG tablet Take 40 mg by mouth daily.    [provider]  benzonatate (TESSALON) 100 MG capsule Take 100 mg by mouth 3 (three) times daily as needed for cough.    [provider]  doxazosin (CARDURA) 2 MG tablet Take 2 mg by mouth daily.    [provider]  famotidine (PEPCID) 20 MG tablet Take 1 tablet (20 mg total) by mouth 2 (two) times daily. 10/02/12   Lacretia Leigh, MD  gentamicin (GARAMYCIN) 0.3 % ophthalmic solution Place 2 drops into the left eye 4 (four) times daily. 06/03/14   Veryl Speak, MD  guaiFENesin-codeine (CHERATUSSIN AC) 100-10 MG/5ML syrup Take 5 mLs by mouth 3 (three) times daily as needed for cough. 02/05/16   Frederica Kuster, PA-C  HYDROcodone-acetaminophen (NORCO/VICODIN) 5-325 MG tablet Take 1 tablet by mouth every 6 (six) hours as needed. 08/18/17   Yukie Bergeron, Barbette Hair, MD  ibuprofen (ADVIL,MOTRIN) 600 MG tablet Take 1 tablet (600 mg total) by mouth every 6 (six) hours as needed. 08/18/17   Joury Allcorn, Barbette Hair, MD  predniSONE (DELTASONE) 10 MG tablet Take 2 tablets (20 mg total) by mouth daily. 10/02/12   Lacretia Leigh, MD  promethazine-dextromethorphan (PROMETHAZINE-DM) 6.25-15 MG/5ML  syrup Take 5 mLs by mouth 4 (four) times daily as needed for cough.    [provider]    Family History No family history on file.  Social History Social History   Tobacco Use  . Smoking status: Former Research scientist (life sciences)  . Smokeless tobacco: Never Used  Substance Use Topics  . Alcohol use: No  . Drug use: No     Allergies   Patient has no known allergies.   Review of Systems Review of Systems  Constitutional: Negative for fever.  Musculoskeletal:       Left hip pain  Neurological: Negative for weakness and numbness.  All other systems reviewed and are negative.    Physical Exam Updated Vital Signs BP (!) 181/94 (BP  Location: Right Arm)   Pulse 68   Temp 97.6 F (36.4 C) (Oral)   Resp 20   Ht 6\' 2"  (1.88 m)   Wt 108.9 kg (240 lb)   SpO2 98%   BMI 30.81 kg/m   Physical Exam  Constitutional: He is oriented to person, place, and time. He appears well-developed and well-nourished. No distress.  HENT:  Head: Normocephalic and atraumatic.  Cardiovascular: Normal rate, regular rhythm and normal heart sounds.  No murmur heard. Pulmonary/Chest: Effort normal and breath sounds normal. No respiratory distress. He has no wheezes.  Abdominal: Soft. There is no tenderness.  Musculoskeletal: He exhibits no edema or deformity.  Normal range of motion left hip, no tenderness to palpation over the greater trochanter, pain with range of motion, no overlying skin changes, 2+ DP pulse, neurovascular intact distally  Neurological: He is alert and oriented to person, place, and time.  Skin: Skin is warm and dry.  Psychiatric: He has a normal mood and affect.  Nursing note and vitals reviewed.    ED Treatments / Results  Labs (all labs ordered are listed, but only abnormal results are displayed) Labs Reviewed - No data to display  EKG None  Radiology Dg Hip Unilat W Or Wo Pelvis 2-3 Views Left  Result Date: 08/18/2017 CLINICAL DATA:  62 year old male with left hip pain.  No trauma. EXAM: DG HIP (WITH OR WITHOUT PELVIS) 2-3V LEFT COMPARISON:  None. FINDINGS: There is no acute fracture or dislocation. The bones are well mineralized. Mild arthritic changes. There is aspherical morphology of the femoral heads, left greater right, with a prominent anterior superior cortex consistent with a CAM type morphology. IMPRESSION: 1. No acute fracture or dislocation. 2. Findings concerning for femoroacetabular impingement secondary to cam type femoral head morphology. Electronically Signed   By: Anner Crete M.D.   On: 08/18/2017 03:55    Procedures Procedures (including critical care time)  Medications Ordered in  ED Medications  ketorolac (TORADOL) 15 MG/ML injection 15 mg (15 mg Intramuscular Given 08/18/17 0314)  oxyCODONE-acetaminophen (PERCOCET/ROXICET) 5-325 MG per tablet 1 tablet (1 tablet Oral Given 08/18/17 0313)     Initial Impression / Assessment and Plan / ED Course  I have reviewed the triage vital signs and the nursing notes.  Pertinent labs & imaging results that were available during my care of the patient were reviewed by me and considered in my medical decision making (see chart for details).     Patient presents with left atraumatic hip pain.  He is overall nontoxic-appearing and vital signs are reassuring with the exception of a blood pressure 181/94.  Patient does have a history of hypertension.  Patient was given Toradol and Percocet.  X-rays obtained.  X-rays are concerning  for tomorrow acetabular impingement as well as some mild arthritis.  He has evidence of this on both sides left greater than right.  Feel this is likely the culprit.  Low suspicion at this time for septic joint or tenosynovitis.  Patient had some improvement with pain medications in the ED.  Recommend anti-inflammatories and a short course of Norco.  Follow-up closely with orthopedist as patient may need an injection for pain relief.  After history, exam, and medical workup I feel the patient has been appropriately medically screened and is safe for discharge home. Pertinent diagnoses were discussed with the patient. Patient was given return precautions.   Final Clinical Impressions(s) / ED Diagnoses   Final diagnoses:  Femoroacetabular impingement of left hip  Left hip pain    ED Discharge Orders        Ordered    ibuprofen (ADVIL,MOTRIN) 600 MG tablet  Every 6 hours PRN     08/18/17 0407    HYDROcodone-acetaminophen (NORCO/VICODIN) 5-325 MG tablet  Every 6 hours PRN     08/18/17 0407       Merryl Hacker, MD 08/18/17 [phone]

## 2017-08-21 ENCOUNTER — Other Ambulatory Visit: Payer: Self-pay

## 2017-08-21 ENCOUNTER — Encounter (HOSPITAL_BASED_OUTPATIENT_CLINIC_OR_DEPARTMENT_OTHER): Payer: Self-pay | Admitting: Emergency Medicine

## 2017-08-21 ENCOUNTER — Emergency Department (HOSPITAL_BASED_OUTPATIENT_CLINIC_OR_DEPARTMENT_OTHER)
Admission: EM | Admit: 2017-08-21 | Discharge: 2017-08-21 | Disposition: A | Discharge status: Home / Self Care | Payer: 59 | Attending: Emergency Medicine | Admitting: Emergency Medicine

## 2017-08-21 DIAGNOSIS — Z955 Presence of coronary angioplasty implant and graft: Secondary | ICD-10-CM | POA: Insufficient documentation

## 2017-08-21 DIAGNOSIS — E119 Type 2 diabetes mellitus without complications: Secondary | ICD-10-CM | POA: Insufficient documentation

## 2017-08-21 DIAGNOSIS — Z7982 Long term (current) use of aspirin: Secondary | ICD-10-CM | POA: Diagnosis not present

## 2017-08-21 DIAGNOSIS — Z79899 Other long term (current) drug therapy: Secondary | ICD-10-CM | POA: Diagnosis not present

## 2017-08-21 DIAGNOSIS — M25552 Pain in left hip: Secondary | ICD-10-CM | POA: Insufficient documentation

## 2017-08-21 DIAGNOSIS — Z7984 Long term (current) use of oral hypoglycemic drugs: Secondary | ICD-10-CM | POA: Insufficient documentation

## 2017-08-21 DIAGNOSIS — I2510 Atherosclerotic heart disease of native coronary artery without angina pectoris: Secondary | ICD-10-CM | POA: Insufficient documentation

## 2017-08-21 MED ORDER — PREDNISONE 20 MG PO TABS: 40.0000 mg | ORAL_TABLET | Freq: Every day | ORAL | 0 refills | Status: AC

## 2017-08-21 MED ORDER — KETOROLAC TROMETHAMINE 60 MG/2ML IM SOLN
15.0000 mg | Freq: Once | INTRAMUSCULAR | Status: AC
Start: 2017-08-21 — End: 2017-08-21
  Administered 2017-08-21: 15 mg via INTRAMUSCULAR
  Filled 2017-08-21: qty 2

## 2017-08-21 MED ORDER — GABAPENTIN 100 MG PO CAPS: 100.0000 mg | ORAL_CAPSULE | Freq: Three times a day (TID) | ORAL | 0 refills | Status: DC

## 2017-08-21 NOTE — ED Provider Notes (Signed)
[facility] Provider Note   CSN: [id number] Arrival date & time: 08/21/17  1237     History   Chief Complaint Chief Complaint  Patient presents with  . Hip Pain    HPI Jeremiah Casey is a 62 y.o. male possible history of diabetes, hyperlipidemia who presents for evaluation of persistent left hip pain.  Patient reports that he was seen at [facility] earlier this week for evaluation of same pain.  Patient reports he had x-rays at that time that showed no evidence of acute fracture dislocation or concerning for some degenerative changes.  Patient reports that he was discharged home with instructions to see outpatient orthopedics.  Patient also reports he was discharged home with pain medications.  Patient reports that he had been taking the pain medications but really was not having much improvement.  Patient reports that today, his pain was worsening, prompting ED visit.  Patient reports that on the ride over here, he states that his pain did improve and reports that it is back to a manageable level on ED arrival.  Patient denies any new preceding trauma, injury, fall.  He has not noticed any overlying warmth, erythema of the area.  He has been able to ambulate but does report some worsening pain with ambulation.  Patient reports that the pain radiates down his leg.  Patient denies any fevers, numbness/weakness, warmth, erythema.  HPI  Past Medical History:  Diagnosis Date  . Coronary artery disease   . Diabetes mellitus without complication ([place])   . Hyperlipemia   . Multiple allergies     There are no active problems to display for this patient.   Past Surgical History:  Procedure Laterality Date  . CORONARY ANGIOPLASTY WITH STENT PLACEMENT    . KNEE SURGERY          Home Medications    Prior to Admission medications   Medication Sig Start Date End Date Taking? Authorizing Provider  aspirin 81 MG tablet Take 81 mg by mouth daily.     [provider]  atorvastatin (LIPITOR) 40 MG tablet Take 40 mg by mouth daily.    [provider]  benzonatate (TESSALON) 100 MG capsule Take 100 mg by mouth 3 (three) times daily as needed for cough.    [provider]  Canagliflozin-metFORMIN HCl (INVOKAMET) 150-500 MG TABS Take by mouth.    [provider]  carvedilol (COREG) 12.5 MG tablet Take 12.5 mg by mouth 2 (two) times daily with a meal.    [provider]  doxazosin (CARDURA) 2 MG tablet Take 2 mg by mouth daily.    [provider]  doxazosin (CARDURA) 2 MG tablet  04/04/14   [provider]  famotidine (PEPCID) 20 MG tablet Take 1 tablet (20 mg total) by mouth 2 (two) times daily. 10/02/12   Lacretia Leigh, MD  gabapentin (NEURONTIN) 100 MG capsule Take 1 capsule (100 mg total) by mouth 3 (three) times daily for 5 days. 08/21/17 08/26/17  Volanda Napoleon, PA-C  gentamicin (GARAMYCIN) 0.3 % ophthalmic solution Place 2 drops into the left eye 4 (four) times daily. 06/03/14   Veryl Speak, MD  guaiFENesin-codeine (CHERATUSSIN AC) 100-10 MG/5ML syrup Take 5 mLs by mouth 3 (three) times daily as needed for cough. 02/05/16   Frederica Kuster, PA-C  HYDROcodone-acetaminophen (NORCO/VICODIN) 5-325 MG tablet Take 1 tablet by mouth every 6 (six) hours as needed. 08/18/17   Horton, Barbette Hair, MD  ibuprofen (  ADVIL,MOTRIN) 600 MG tablet Take 1 tablet (600 mg total) by mouth every 6 (six) hours as needed. 08/18/17   Horton, Barbette Hair, MD  metFORMIN (GLUCOPHAGE) 500 MG tablet Take by mouth 2 (two) times daily with a meal.    [provider]  metoprolol tartrate (LOPRESSOR) 25 MG tablet Take 25 mg by mouth 2 (two) times daily.    [provider]  predniSONE (DELTASONE) 10 MG tablet Take 2 tablets (20 mg total) by mouth daily. 10/02/12   Lacretia Leigh, MD  predniSONE (DELTASONE) 20 MG tablet Take 2 tablets (40 mg total) by mouth daily for 3 days. 08/21/17 08/24/17  Volanda Napoleon, PA-C  promethazine-dextromethorphan (PROMETHAZINE-DM) 6.25-15 MG/5ML syrup Take 5 mLs by mouth 4 (four) times daily as needed for cough.    [provider]    Family History No family history on file.  Social History Social History   Tobacco Use  . Smoking status: Former Research scientist (life sciences)  . Smokeless tobacco: Never Used  Substance Use Topics  . Alcohol use: No  . Drug use: No     Allergies   Patient has no known allergies.   Review of Systems Review of Systems  Constitutional: Negative for fever.  Musculoskeletal:       Left Hip pain  Skin: Negative for color change.  Neurological: Negative for weakness and numbness.     Physical Exam Updated Vital Signs BP (!) 149/104   Pulse 66   Temp 98.1 F (36.7 C) (Oral)   Resp 18   Ht 6\' 2"  (1.88 m)   Wt 108.9 kg (240 lb)   SpO2 100%   BMI 30.81 kg/m   Physical Exam  Constitutional: He appears well-developed and well-nourished.  HENT:  Head: Normocephalic and atraumatic.  Eyes: Conjunctivae and EOM are normal. Right eye exhibits no discharge. Left eye exhibits no discharge. No scleral icterus.  Cardiovascular:  Pulses:      Dorsalis pedis pulses are 2+ on the right side, and 2+ on the left side.  Pulmonary/Chest: Effort normal.  Musculoskeletal:  Muscular tenderness noted over the anterior thigh/left hip that radiates to the anterior thigh.  No overlying warmth, erythema.  No deformity or crepitus noted.  Flexion/extension of left lower extremity intact without any difficulty.  Patient able to tolerate internal and external rotation of left hip without any difficulty.  No tenderness palpation to left knee, left tib-fib, left ankle.  No abnormalities of the right lower extremity.  Neurological: He is alert.  Sensation intact along major nerve distributions of BLE  Skin: Skin is warm and dry. Capillary refill takes less than 2 seconds.  Good distal cap refill. BLE are not dusky in appearance or cool to touch.    Psychiatric: He has a normal mood and affect. His speech is normal and behavior is normal.  Nursing note and vitals reviewed.    ED Treatments / Results  Labs (all labs ordered are listed, but only abnormal results are displayed) Labs Reviewed - No data to display  EKG None  Radiology No results found.  Procedures Procedures (including critical care time)  Medications Ordered in ED Medications  ketorolac (TORADOL) injection 15 mg (15 mg Intramuscular Given 08/21/17 1500)     Initial Impression / Assessment and Plan / ED Course  I have reviewed the triage vital signs and the nursing notes.  Pertinent labs & imaging results that were available during my care of the patient were reviewed by me and considered in my  medical decision making (see chart for details).     62 y.o. M with past medical history of diabetes who presents for evaluation of persistent left hip pain.  Was seen here at [facility] on 08/18/2017 for evaluation of pain.  Reports that he had x-rays done and was discharged home with plans to follow-up with outpatient orthopedic and pain medication.  Reports that the pain medication has not really been improving his pain.  He reports worsening pain today.  No new trauma, injury, fall.  No warmth, erythema, fevers, numbness/weakness. Patient is afebrile, non-toxic appearing, sitting comfortably on examination table. Vital signs reviewed and stable.  Patient is neurovascularly intact.  Full range of motion of hip without any difficulty.  There is no overlying warmth, erythema of left hip.  History/physical exam is not concerning for septic arthritis, DVT, acute arterial embolism.  Patient reports that the shot that he got last time he was here significant he helped his pain.  Review of records.  Patient was seen in the ED on 08/18/2017.  He received IM Toradol at that time.  Review of his imaging showed no acute fracture or dislocation.  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   At this time, patient exhibits no signs of infectious etiology that would be concerning for septic arthritis.  Additionally, do not feel that repeat imaging is necessary as patient has had no new trauma, injury, fall.  Patient reports his pain is actually improved since being here in the ED.  Vital signs are stable at this time.  We will plan to give additional analgesics and steroids for continued pain relief.  Instructed patient that he needs to follow-up with orthopedics for further evaluation. Patient had ample opportunity for questions and discussion. All patient's questions were answered with full understanding. Strict return precautions discussed. Patient expresses understanding and agreement to plan.   Final Clinical Impressions(s) / ED Diagnoses   Final diagnoses:  Left hip pain    ED Discharge Orders        Ordered    predniSONE (DELTASONE) 20 MG tablet  Daily     08/21/17 1548    gabapentin (NEURONTIN) 100 MG capsule  3 times daily     08/21/17 1548       Desma Mcgregor 08/21/17 2034    Tegeler, Gwenyth Allegra, MD 08/21/17 2051

## 2017-08-21 NOTE — Discharge Instructions (Signed)
Take prednisone as directed.   Take Gabapentin as directed.   As we discussed, you need to follow-up with referred orthopedic doctor.  Return the emergency department any redness or swelling of the hip, fevers, numbness/weakness of the arms or legs or any other worsening or concerning symptoms.

## 2017-08-21 NOTE — ED Triage Notes (Signed)
L hip pain x 1 week. Seen here earlier this week, no relief with meds prescribed.

## 2017-08-24 DIAGNOSIS — M5416 Radiculopathy, lumbar region: Secondary | ICD-10-CM | POA: Diagnosis not present

## 2017-09-06 DIAGNOSIS — M545 Low back pain: Secondary | ICD-10-CM | POA: Diagnosis not present

## 2017-09-06 DIAGNOSIS — M5417 Radiculopathy, lumbosacral region: Secondary | ICD-10-CM | POA: Diagnosis not present

## 2017-09-06 DIAGNOSIS — M9903 Segmental and somatic dysfunction of lumbar region: Secondary | ICD-10-CM | POA: Diagnosis not present

## 2017-09-08 DIAGNOSIS — M5417 Radiculopathy, lumbosacral region: Secondary | ICD-10-CM | POA: Diagnosis not present

## 2017-09-08 DIAGNOSIS — M9903 Segmental and somatic dysfunction of lumbar region: Secondary | ICD-10-CM | POA: Diagnosis not present

## 2017-09-08 DIAGNOSIS — M545 Low back pain: Secondary | ICD-10-CM | POA: Diagnosis not present

## 2017-09-09 DIAGNOSIS — M5417 Radiculopathy, lumbosacral region: Secondary | ICD-10-CM | POA: Diagnosis not present

## 2017-09-09 DIAGNOSIS — M545 Low back pain: Secondary | ICD-10-CM | POA: Diagnosis not present

## 2017-09-09 DIAGNOSIS — M9903 Segmental and somatic dysfunction of lumbar region: Secondary | ICD-10-CM | POA: Diagnosis not present

## 2017-09-12 DIAGNOSIS — M48061 Spinal stenosis, lumbar region without neurogenic claudication: Secondary | ICD-10-CM | POA: Diagnosis not present

## 2017-09-12 DIAGNOSIS — M5136 Other intervertebral disc degeneration, lumbar region: Secondary | ICD-10-CM | POA: Diagnosis not present

## 2017-09-12 DIAGNOSIS — M5416 Radiculopathy, lumbar region: Secondary | ICD-10-CM | POA: Diagnosis not present

## 2017-09-13 DIAGNOSIS — M4807 Spinal stenosis, lumbosacral region: Secondary | ICD-10-CM | POA: Diagnosis not present

## 2017-09-14 DIAGNOSIS — M5417 Radiculopathy, lumbosacral region: Secondary | ICD-10-CM | POA: Diagnosis not present

## 2017-09-14 DIAGNOSIS — M545 Low back pain: Secondary | ICD-10-CM | POA: Diagnosis not present

## 2017-09-14 DIAGNOSIS — M9903 Segmental and somatic dysfunction of lumbar region: Secondary | ICD-10-CM | POA: Diagnosis not present

## 2017-09-16 DIAGNOSIS — M545 Low back pain: Secondary | ICD-10-CM | POA: Diagnosis not present

## 2017-09-16 DIAGNOSIS — M5417 Radiculopathy, lumbosacral region: Secondary | ICD-10-CM | POA: Diagnosis not present

## 2017-09-16 DIAGNOSIS — M9903 Segmental and somatic dysfunction of lumbar region: Secondary | ICD-10-CM | POA: Diagnosis not present

## 2017-09-20 DIAGNOSIS — M545 Low back pain: Secondary | ICD-10-CM | POA: Diagnosis not present

## 2017-09-20 DIAGNOSIS — M5417 Radiculopathy, lumbosacral region: Secondary | ICD-10-CM | POA: Diagnosis not present

## 2017-09-20 DIAGNOSIS — M9903 Segmental and somatic dysfunction of lumbar region: Secondary | ICD-10-CM | POA: Diagnosis not present

## 2017-09-22 DIAGNOSIS — Z79899 Other long term (current) drug therapy: Secondary | ICD-10-CM | POA: Diagnosis not present

## 2017-09-22 DIAGNOSIS — E782 Mixed hyperlipidemia: Secondary | ICD-10-CM | POA: Diagnosis not present

## 2017-09-22 DIAGNOSIS — E559 Vitamin D deficiency, unspecified: Secondary | ICD-10-CM | POA: Diagnosis not present

## 2017-09-22 DIAGNOSIS — E1165 Type 2 diabetes mellitus with hyperglycemia: Secondary | ICD-10-CM | POA: Diagnosis not present

## 2017-09-23 DIAGNOSIS — M5417 Radiculopathy, lumbosacral region: Secondary | ICD-10-CM | POA: Diagnosis not present

## 2017-09-23 DIAGNOSIS — M9903 Segmental and somatic dysfunction of lumbar region: Secondary | ICD-10-CM | POA: Diagnosis not present

## 2017-09-23 DIAGNOSIS — M545 Low back pain: Secondary | ICD-10-CM | POA: Diagnosis not present

## 2017-09-27 DIAGNOSIS — M9903 Segmental and somatic dysfunction of lumbar region: Secondary | ICD-10-CM | POA: Diagnosis not present

## 2017-09-27 DIAGNOSIS — M5417 Radiculopathy, lumbosacral region: Secondary | ICD-10-CM | POA: Diagnosis not present

## 2017-09-27 DIAGNOSIS — M545 Low back pain: Secondary | ICD-10-CM | POA: Diagnosis not present

## 2017-09-30 DIAGNOSIS — M545 Low back pain: Secondary | ICD-10-CM | POA: Diagnosis not present

## 2017-09-30 DIAGNOSIS — M5417 Radiculopathy, lumbosacral region: Secondary | ICD-10-CM | POA: Diagnosis not present

## 2017-09-30 DIAGNOSIS — M9903 Segmental and somatic dysfunction of lumbar region: Secondary | ICD-10-CM | POA: Diagnosis not present

## 2017-10-04 DIAGNOSIS — M5417 Radiculopathy, lumbosacral region: Secondary | ICD-10-CM | POA: Diagnosis not present

## 2017-10-04 DIAGNOSIS — M545 Low back pain: Secondary | ICD-10-CM | POA: Diagnosis not present

## 2017-10-04 DIAGNOSIS — M9903 Segmental and somatic dysfunction of lumbar region: Secondary | ICD-10-CM | POA: Diagnosis not present

## 2017-10-13 DIAGNOSIS — G4733 Obstructive sleep apnea (adult) (pediatric): Secondary | ICD-10-CM | POA: Diagnosis not present

## 2017-10-13 DIAGNOSIS — Z96659 Presence of unspecified artificial knee joint: Secondary | ICD-10-CM | POA: Diagnosis not present

## 2017-10-14 DIAGNOSIS — M545 Low back pain: Secondary | ICD-10-CM | POA: Diagnosis not present

## 2017-10-14 DIAGNOSIS — M9903 Segmental and somatic dysfunction of lumbar region: Secondary | ICD-10-CM | POA: Diagnosis not present

## 2017-10-14 DIAGNOSIS — M5417 Radiculopathy, lumbosacral region: Secondary | ICD-10-CM | POA: Diagnosis not present

## 2017-10-21 DIAGNOSIS — M5416 Radiculopathy, lumbar region: Secondary | ICD-10-CM | POA: Diagnosis not present

## 2017-10-21 DIAGNOSIS — M5136 Other intervertebral disc degeneration, lumbar region: Secondary | ICD-10-CM | POA: Diagnosis not present

## 2017-10-21 DIAGNOSIS — M47896 Other spondylosis, lumbar region: Secondary | ICD-10-CM | POA: Diagnosis not present

## 2017-11-04 DIAGNOSIS — M47816 Spondylosis without myelopathy or radiculopathy, lumbar region: Secondary | ICD-10-CM | POA: Diagnosis not present

## 2017-11-13 DIAGNOSIS — G4733 Obstructive sleep apnea (adult) (pediatric): Secondary | ICD-10-CM | POA: Diagnosis not present

## 2017-11-13 DIAGNOSIS — Z96659 Presence of unspecified artificial knee joint: Secondary | ICD-10-CM | POA: Diagnosis not present

## 2017-11-23 IMAGING — CR DG CHEST 2V
2 series · 2 of 2 positions shown · non-contrast
Comparison: None.

CLINICAL DATA: Cough

EXAM:
CHEST  2 VIEW

[w chest pa]
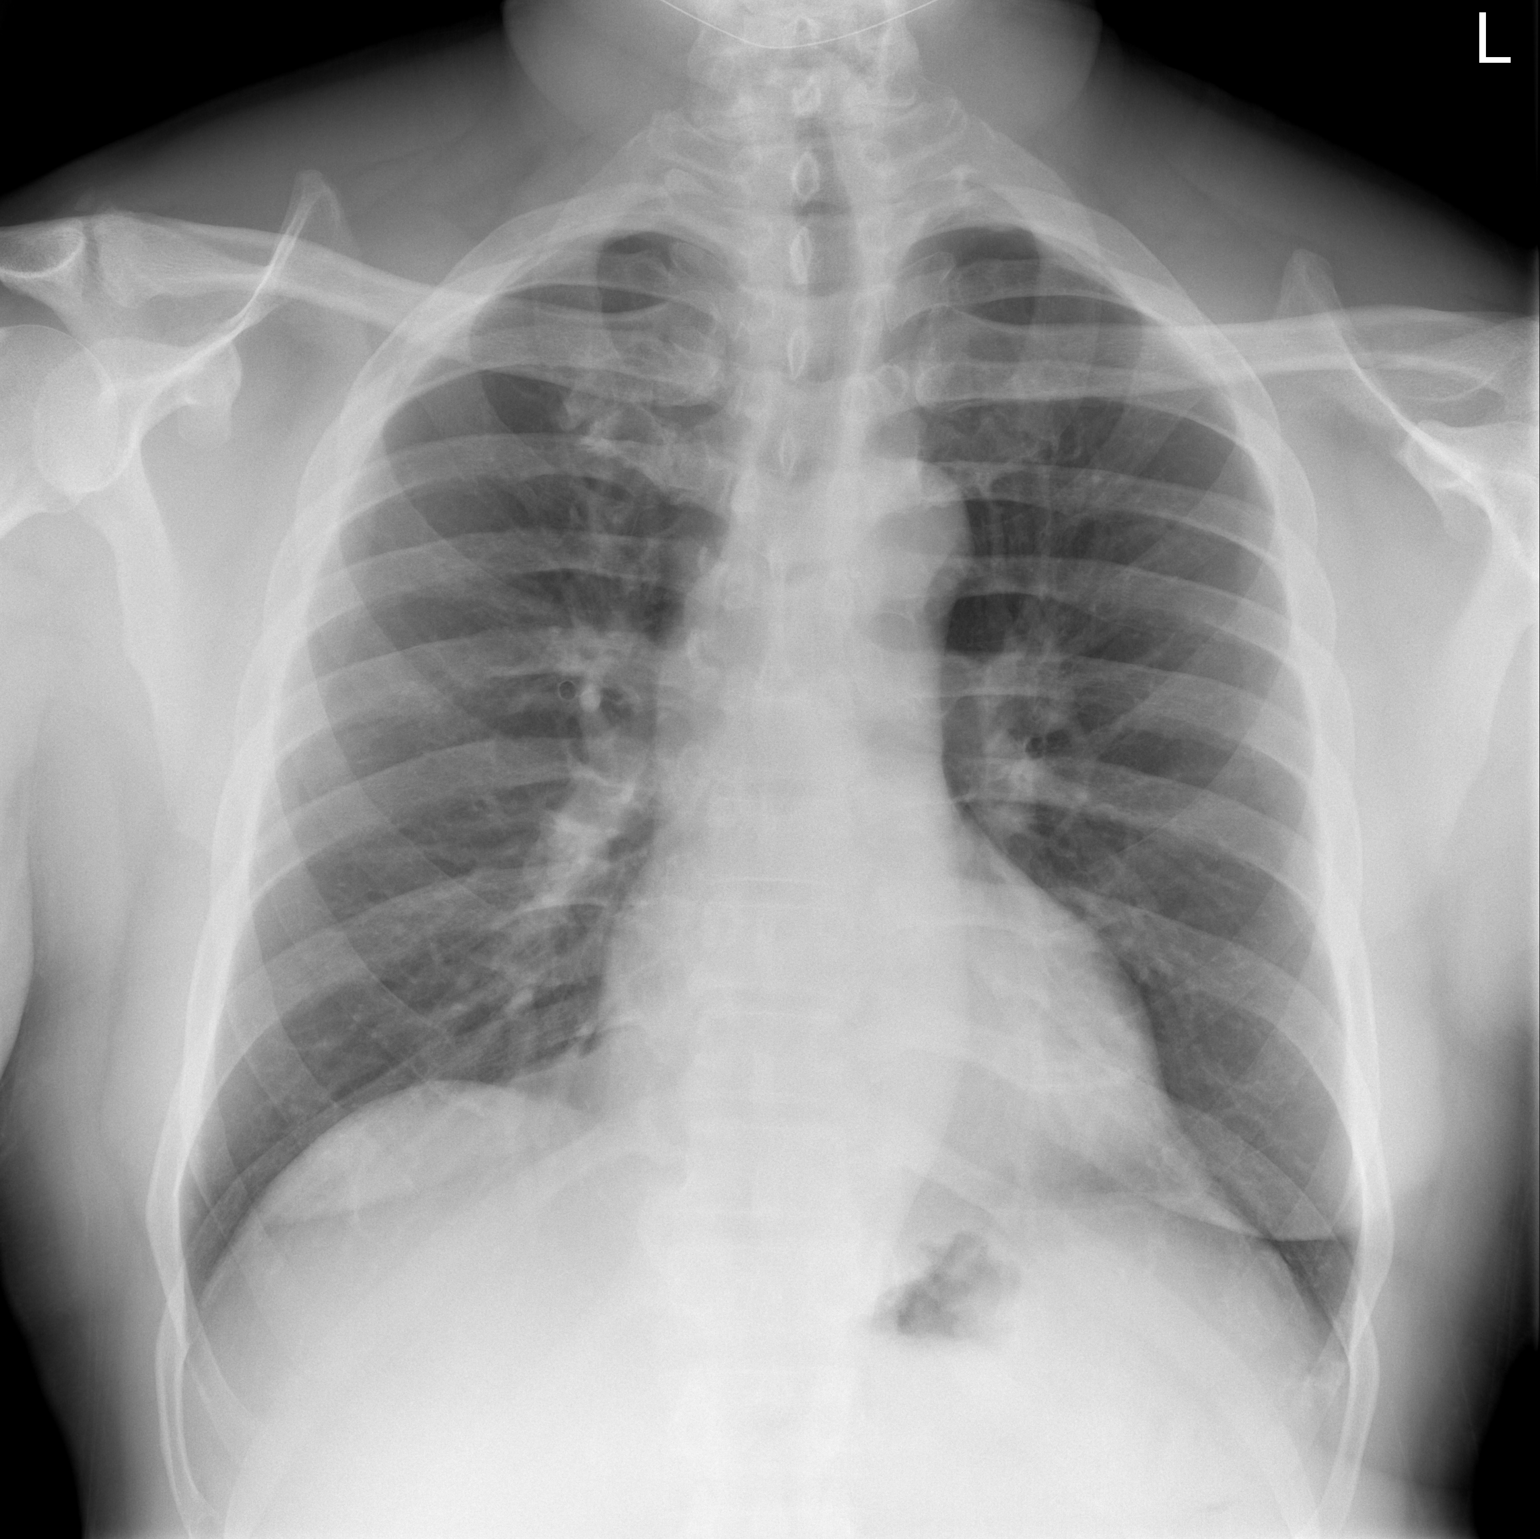

[w chest lat]
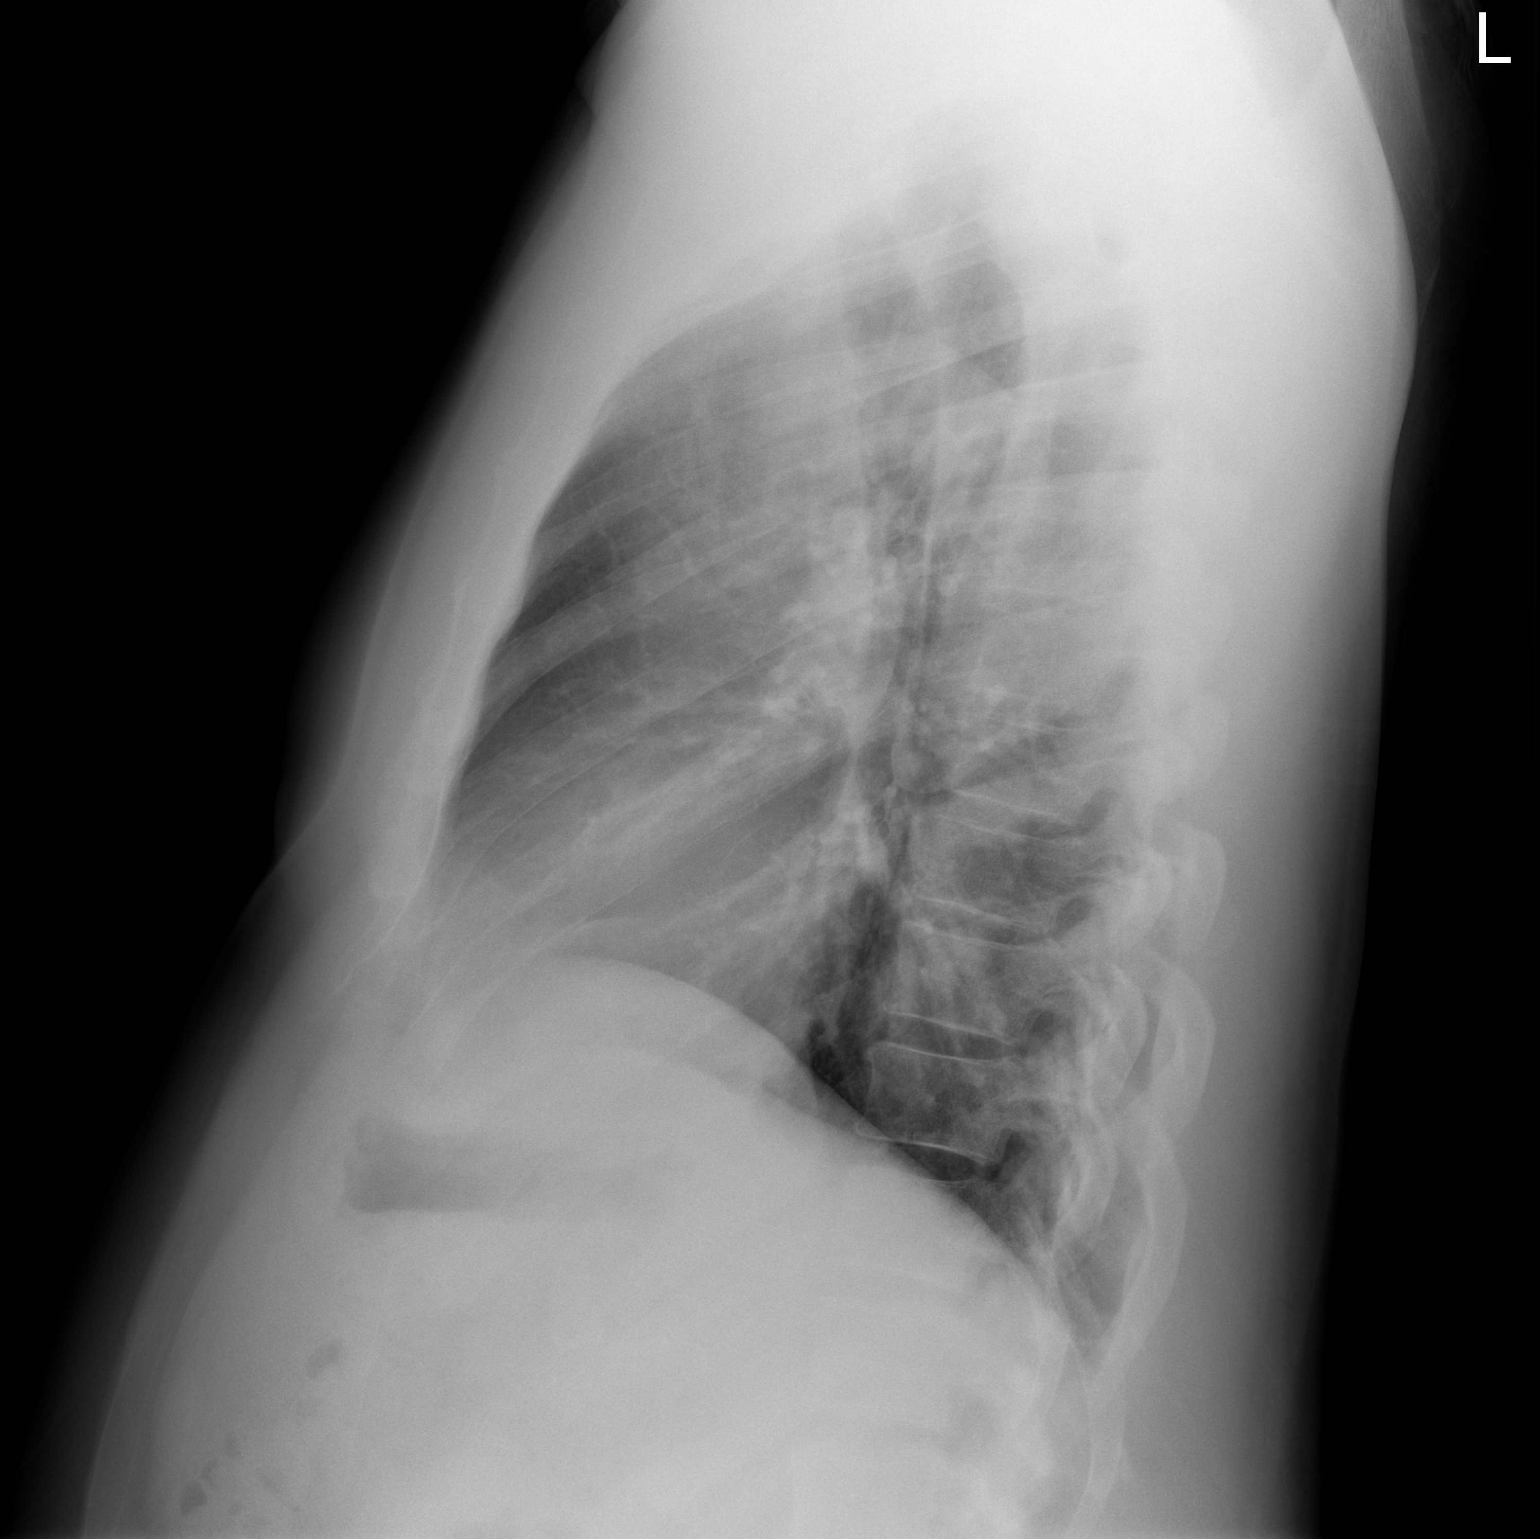

[2 of 2 positions shown; findings below may reference images not displayed]

FINDINGS: The heart size and mediastinal contours are within normal limits.
Both lungs are clear. The visualized skeletal structures are
unremarkable.
IMPRESSION: No active cardiopulmonary disease.

## 2017-12-08 DIAGNOSIS — M5136 Other intervertebral disc degeneration, lumbar region: Secondary | ICD-10-CM | POA: Diagnosis not present

## 2017-12-08 DIAGNOSIS — M47896 Other spondylosis, lumbar region: Secondary | ICD-10-CM | POA: Diagnosis not present

## 2017-12-08 DIAGNOSIS — M5416 Radiculopathy, lumbar region: Secondary | ICD-10-CM | POA: Diagnosis not present

## 2017-12-13 DIAGNOSIS — Z96659 Presence of unspecified artificial knee joint: Secondary | ICD-10-CM | POA: Diagnosis not present

## 2017-12-13 DIAGNOSIS — G4733 Obstructive sleep apnea (adult) (pediatric): Secondary | ICD-10-CM | POA: Diagnosis not present

## 2017-12-14 DIAGNOSIS — Z23 Encounter for immunization: Secondary | ICD-10-CM | POA: Diagnosis not present

## 2017-12-28 DIAGNOSIS — Z125 Encounter for screening for malignant neoplasm of prostate: Secondary | ICD-10-CM | POA: Diagnosis not present

## 2017-12-28 DIAGNOSIS — E1165 Type 2 diabetes mellitus with hyperglycemia: Secondary | ICD-10-CM | POA: Diagnosis not present

## 2017-12-28 DIAGNOSIS — E782 Mixed hyperlipidemia: Secondary | ICD-10-CM | POA: Diagnosis not present

## 2017-12-28 DIAGNOSIS — E559 Vitamin D deficiency, unspecified: Secondary | ICD-10-CM | POA: Diagnosis not present

## 2017-12-28 DIAGNOSIS — Z79899 Other long term (current) drug therapy: Secondary | ICD-10-CM | POA: Diagnosis not present

## 2018-01-10 DIAGNOSIS — M533 Sacrococcygeal disorders, not elsewhere classified: Secondary | ICD-10-CM | POA: Diagnosis not present

## 2018-01-10 DIAGNOSIS — M5416 Radiculopathy, lumbar region: Secondary | ICD-10-CM | POA: Diagnosis not present

## 2018-01-10 DIAGNOSIS — M25551 Pain in right hip: Secondary | ICD-10-CM | POA: Diagnosis not present

## 2018-01-13 DIAGNOSIS — G4733 Obstructive sleep apnea (adult) (pediatric): Secondary | ICD-10-CM | POA: Diagnosis not present

## 2018-01-13 DIAGNOSIS — Z96659 Presence of unspecified artificial knee joint: Secondary | ICD-10-CM | POA: Diagnosis not present

## 2018-01-17 DIAGNOSIS — D126 Benign neoplasm of colon, unspecified: Secondary | ICD-10-CM | POA: Diagnosis not present

## 2018-01-17 DIAGNOSIS — K621 Rectal polyp: Secondary | ICD-10-CM | POA: Diagnosis not present

## 2018-01-17 DIAGNOSIS — Z1211 Encounter for screening for malignant neoplasm of colon: Secondary | ICD-10-CM | POA: Diagnosis not present

## 2018-01-17 DIAGNOSIS — Z86010 Personal history of colonic polyps: Secondary | ICD-10-CM | POA: Diagnosis not present

## 2018-01-17 DIAGNOSIS — D125 Benign neoplasm of sigmoid colon: Secondary | ICD-10-CM | POA: Diagnosis not present

## 2018-01-17 DIAGNOSIS — D123 Benign neoplasm of transverse colon: Secondary | ICD-10-CM | POA: Diagnosis not present

## 2018-01-17 DIAGNOSIS — D120 Benign neoplasm of cecum: Secondary | ICD-10-CM | POA: Diagnosis not present

## 2018-01-19 DIAGNOSIS — S76311A Strain of muscle, fascia and tendon of the posterior muscle group at thigh level, right thigh, initial encounter: Secondary | ICD-10-CM | POA: Diagnosis not present

## 2018-01-19 DIAGNOSIS — M25551 Pain in right hip: Secondary | ICD-10-CM | POA: Diagnosis not present

## 2018-01-25 DIAGNOSIS — M7602 Gluteal tendinitis, left hip: Secondary | ICD-10-CM | POA: Diagnosis not present

## 2018-01-25 DIAGNOSIS — M5416 Radiculopathy, lumbar region: Secondary | ICD-10-CM | POA: Diagnosis not present

## 2018-02-09 DIAGNOSIS — M5416 Radiculopathy, lumbar region: Secondary | ICD-10-CM | POA: Diagnosis not present

## 2018-02-09 DIAGNOSIS — M25551 Pain in right hip: Secondary | ICD-10-CM | POA: Diagnosis not present

## 2018-02-12 DIAGNOSIS — Z96659 Presence of unspecified artificial knee joint: Secondary | ICD-10-CM | POA: Diagnosis not present

## 2018-02-12 DIAGNOSIS — G4733 Obstructive sleep apnea (adult) (pediatric): Secondary | ICD-10-CM | POA: Diagnosis not present

## 2018-02-21 DIAGNOSIS — M5416 Radiculopathy, lumbar region: Secondary | ICD-10-CM | POA: Diagnosis not present

## 2018-02-24 DIAGNOSIS — M5416 Radiculopathy, lumbar region: Secondary | ICD-10-CM | POA: Diagnosis not present

## 2018-03-02 DIAGNOSIS — M5416 Radiculopathy, lumbar region: Secondary | ICD-10-CM | POA: Diagnosis not present

## 2018-03-07 DIAGNOSIS — M5416 Radiculopathy, lumbar region: Secondary | ICD-10-CM | POA: Diagnosis not present

## 2018-03-07 DIAGNOSIS — M25551 Pain in right hip: Secondary | ICD-10-CM | POA: Diagnosis not present

## 2018-03-15 DIAGNOSIS — Z96659 Presence of unspecified artificial knee joint: Secondary | ICD-10-CM | POA: Diagnosis not present

## 2018-03-15 DIAGNOSIS — G4733 Obstructive sleep apnea (adult) (pediatric): Secondary | ICD-10-CM | POA: Diagnosis not present

## 2018-03-16 DIAGNOSIS — M25551 Pain in right hip: Secondary | ICD-10-CM | POA: Diagnosis not present

## 2018-03-16 DIAGNOSIS — M5416 Radiculopathy, lumbar region: Secondary | ICD-10-CM | POA: Diagnosis not present

## 2018-03-21 DIAGNOSIS — M25551 Pain in right hip: Secondary | ICD-10-CM | POA: Diagnosis not present

## 2018-03-21 DIAGNOSIS — M5416 Radiculopathy, lumbar region: Secondary | ICD-10-CM | POA: Diagnosis not present

## 2018-03-28 DIAGNOSIS — M25551 Pain in right hip: Secondary | ICD-10-CM | POA: Diagnosis not present

## 2018-04-04 DIAGNOSIS — M25551 Pain in right hip: Secondary | ICD-10-CM | POA: Diagnosis not present

## 2018-04-04 DIAGNOSIS — M5416 Radiculopathy, lumbar region: Secondary | ICD-10-CM | POA: Diagnosis not present

## 2018-04-08 DIAGNOSIS — E118 Type 2 diabetes mellitus with unspecified complications: Secondary | ICD-10-CM | POA: Diagnosis not present

## 2018-04-08 DIAGNOSIS — E559 Vitamin D deficiency, unspecified: Secondary | ICD-10-CM | POA: Diagnosis not present

## 2018-04-08 DIAGNOSIS — Z79899 Other long term (current) drug therapy: Secondary | ICD-10-CM | POA: Diagnosis not present

## 2018-04-08 DIAGNOSIS — E1169 Type 2 diabetes mellitus with other specified complication: Secondary | ICD-10-CM | POA: Diagnosis not present

## 2018-04-08 DIAGNOSIS — L509 Urticaria, unspecified: Secondary | ICD-10-CM | POA: Diagnosis not present

## 2018-04-12 DIAGNOSIS — E785 Hyperlipidemia, unspecified: Secondary | ICD-10-CM | POA: Diagnosis not present

## 2018-04-12 DIAGNOSIS — I2510 Atherosclerotic heart disease of native coronary artery without angina pectoris: Secondary | ICD-10-CM | POA: Diagnosis not present

## 2018-04-12 DIAGNOSIS — I10 Essential (primary) hypertension: Secondary | ICD-10-CM | POA: Diagnosis not present

## 2018-04-13 DIAGNOSIS — M25551 Pain in right hip: Secondary | ICD-10-CM | POA: Diagnosis not present

## 2018-04-13 DIAGNOSIS — M5416 Radiculopathy, lumbar region: Secondary | ICD-10-CM | POA: Diagnosis not present

## 2018-04-15 DIAGNOSIS — Z96659 Presence of unspecified artificial knee joint: Secondary | ICD-10-CM | POA: Diagnosis not present

## 2018-04-15 DIAGNOSIS — G4733 Obstructive sleep apnea (adult) (pediatric): Secondary | ICD-10-CM | POA: Diagnosis not present

## 2018-04-21 DIAGNOSIS — M6798 Unspecified disorder of synovium and tendon, other site: Secondary | ICD-10-CM | POA: Diagnosis not present

## 2018-04-21 DIAGNOSIS — M47816 Spondylosis without myelopathy or radiculopathy, lumbar region: Secondary | ICD-10-CM | POA: Diagnosis not present

## 2018-04-25 ENCOUNTER — Ambulatory Visit (INDEPENDENT_AMBULATORY_CARE_PROVIDER_SITE_OTHER): Payer: 59 | Admitting: Pediatrics

## 2018-04-25 ENCOUNTER — Encounter: Payer: Self-pay | Admitting: Pediatrics

## 2018-04-25 VITALS — BP 142/80 | HR 80 | Temp 98.2°F | Resp 20 | Ht 73.25 in | Wt 260.0 lb

## 2018-04-25 DIAGNOSIS — T783XXD Angioneurotic edema, subsequent encounter: Secondary | ICD-10-CM

## 2018-04-25 DIAGNOSIS — L508 Other urticaria: Secondary | ICD-10-CM | POA: Diagnosis not present

## 2018-04-25 DIAGNOSIS — Z96653 Presence of artificial knee joint, bilateral: Secondary | ICD-10-CM

## 2018-04-25 DIAGNOSIS — Z8669 Personal history of other diseases of the nervous system and sense organs: Secondary | ICD-10-CM

## 2018-04-25 DIAGNOSIS — Z955 Presence of coronary angioplasty implant and graft: Secondary | ICD-10-CM

## 2018-04-25 DIAGNOSIS — T782XXA Anaphylactic shock, unspecified, initial encounter: Secondary | ICD-10-CM | POA: Insufficient documentation

## 2018-04-25 DIAGNOSIS — J4520 Mild intermittent asthma, uncomplicated: Secondary | ICD-10-CM

## 2018-04-25 DIAGNOSIS — J3089 Other allergic rhinitis: Secondary | ICD-10-CM | POA: Diagnosis not present

## 2018-04-25 DIAGNOSIS — T782XXD Anaphylactic shock, unspecified, subsequent encounter: Secondary | ICD-10-CM | POA: Diagnosis not present

## 2018-04-25 DIAGNOSIS — T783XXA Angioneurotic edema, initial encounter: Secondary | ICD-10-CM | POA: Insufficient documentation

## 2018-04-25 DIAGNOSIS — R5383 Other fatigue: Secondary | ICD-10-CM

## 2018-04-25 MED ORDER — MONTELUKAST SODIUM 10 MG PO TABS: ORAL_TABLET | ORAL | 5 refills | Status: DC

## 2018-04-25 MED ORDER — FAMOTIDINE 20 MG PO TABS: 20.0000 mg | ORAL_TABLET | Freq: Two times a day (BID) | ORAL | 5 refills | Status: DC

## 2018-04-25 MED ORDER — EPINEPHRINE 0.3 MG/0.3ML IJ SOAJ: INTRAMUSCULAR | 2 refills | Status: AC

## 2018-04-25 MED ORDER — ALBUTEROL SULFATE HFA 108 (90 BASE) MCG/ACT IN AERS: 2.0000 | INHALATION_SPRAY | RESPIRATORY_TRACT | 2 refills | Status: AC | PRN

## 2018-04-25 NOTE — Progress Notes (Signed)
[address] [place] [address] Dept: [phone]  New Patient Note  Patient ID: Jeremiah Casey, male    DOB: 21-Dec-1955  Age: 63 y.o. MRN: [id number] Date of Office Visit: 04/25/2018 Referring provider: Janine Limbo, PA-C [facility], [place] [id number]    Chief Complaint: Urticaria and Oral Swelling  HPI Jeremiah Casey presents for evaluation of recurrent episodes of hives with angioedema since he was a teenager His episodes have become more frequent in the past 2 months.  He has had hives and swelling of his lips.  Sometimes he has swelling of his throat.  There are no clear-cut precipitants to his symptoms.  He has had asthma for about 10y ears has had some shortness of breath with exercise.  He has had sinus problems aggravated by exposure to dust, cigarette smoke and weather changes.  He had a heart stent placed in 2006.  He has had bilateral knee replacements.  He is prediabetic.  He has never had eczema.  He has not had tick bites or gastroesophageal reflux.  He has a history of sleep apnea.  Review of Systems  Constitutional: Negative.   HENT:       Sinus problems for several years .  History of sleep apnea  Eyes: Negative.   Cardiovascular:       Heart stent in 2006  Gastrointestinal:       Occasional heartburn  Genitourinary:       Enlarged prostate  Musculoskeletal:       Bilateral knee replacement.  Osteoarthritis in his back  Skin:       Recurrent bouts of urticaria and angioedema since he was a teenager.  Getting worse.  Sometimes has difficulty swallowing  Neurological: Negative.   Endo/Heme/Allergies:       No thyroid disease.  Prediabetic  Psychiatric/Behavioral: Negative.     Outpatient Encounter Medications as of 04/25/2018  Medication Sig  . albuterol (PROVENTIL HFA;VENTOLIN HFA) 108 (90 Base) MCG/ACT inhaler Inhale into the lungs.  Marland Kitchen aspirin 81 MG tablet Take 81 mg by mouth daily.  Marland Kitchen atorvastatin (LIPITOR) 40 MG tablet Take 40 mg by  mouth daily.  . Canagliflozin-metFORMIN HCl (INVOKAMET) 150-500 MG TABS   . Cholecalciferol (VITAMIN D3) 1.25 MG (50000 UT) CAPS   . doxazosin (CARDURA) 2 MG tablet   . famotidine (PEPCID) 20 MG tablet Take 1 tablet (20 mg total) by mouth 2 (two) times daily.  Marland Kitchen ibuprofen (ADVIL,MOTRIN) 600 MG tablet Take 1 tablet (600 mg total) by mouth every 6 (six) hours as needed.  . metFORMIN (GLUCOPHAGE) 500 MG tablet Take by mouth 2 (two) times daily with a meal.  . metoprolol tartrate (LOPRESSOR) 25 MG tablet Take 25 mg by mouth 2 (two) times daily.  . TRADJENTA 5 MG TABS tablet   . [DISCONTINUED] Canagliflozin-metFORMIN HCl (INVOKAMET) 150-500 MG TABS Take by mouth.  . [DISCONTINUED] carvedilol (COREG) 12.5 MG tablet Take 12.5 mg by mouth 2 (two) times daily with a meal.  . [DISCONTINUED] famotidine (PEPCID) 20 MG tablet Take 1 tablet (20 mg total) by mouth 2 (two) times daily.  . [DISCONTINUED] famotidine (PEPCID) 20 MG tablet Take 1 tablet (20 mg total) by mouth 2 (two) times daily.  Marland Kitchen albuterol (VENTOLIN HFA) 108 (90 Base) MCG/ACT inhaler Inhale 2 puffs into the lungs every 4 (four) hours as needed for wheezing or shortness of breath.  . EPINEPHrine (AUVI-Q) 0.3 mg/0.3 mL IJ SOAJ injection Use as directed for severe allergic reactions  .  montelukast (SINGULAIR) 10 MG tablet Take 1 tablet once a day to prevent coughing or wheezing  . [DISCONTINUED] benzonatate (TESSALON) 100 MG capsule Take 100 mg by mouth 3 (three) times daily as needed for cough.  . [DISCONTINUED] doxazosin (CARDURA) 2 MG tablet Take 2 mg by mouth daily.  . [DISCONTINUED] gabapentin (NEURONTIN) 100 MG capsule Take 1 capsule (100 mg total) by mouth 3 (three) times daily for 5 days.  . [DISCONTINUED] gentamicin (GARAMYCIN) 0.3 % ophthalmic solution Place 2 drops into the left eye 4 (four) times daily.  . [DISCONTINUED] guaiFENesin-codeine (CHERATUSSIN AC) 100-10 MG/5ML syrup Take 5 mLs by mouth 3 (three) times daily as needed for  cough.  . [DISCONTINUED] HYDROcodone-acetaminophen (NORCO/VICODIN) 5-325 MG tablet Take 1 tablet by mouth every 6 (six) hours as needed.  . [DISCONTINUED] predniSONE (DELTASONE) 10 MG tablet Take 2 tablets (20 mg total) by mouth daily.  . [DISCONTINUED] promethazine-dextromethorphan (PROMETHAZINE-DM) 6.25-15 MG/5ML syrup Take 5 mLs by mouth 4 (four) times daily as needed for cough.   No facility-administered encounter medications on file as of 04/25/2018.      Drug Allergies:  No Known Allergies  Family History: Jeremiah Casey's family history includes Asthma in his sister; Eczema in his brother; Urticaria in his brother..  Family history is negative for angioedema, food allergies, lupus, chronic bronchitis or emphysema  Social and environmental.  There are no pets in the home.  He is not exposed to cigarette smoking.  He works in Scientist, research (life sciences).  He quit smoking cigarettes 35 years ago after smoking a pack every 2 to 3 days for about 10 years  Physical Exam: BP (!) 142/80   Pulse 80   Temp 98.2 F (36.8 C) (Oral)   Resp 20   Ht 6' 1.25" (1.861 m)   Wt 260 lb (117.9 kg)   SpO2 95%   BMI 34.07 kg/m    Physical Exam Vitals signs reviewed.  Constitutional:      Appearance: Normal appearance. He is normal weight.  HENT:     Head:     Comments: Eyes normal.  Ears normal.  Nose normal.  Pharynx normal. Neck:     Musculoskeletal: Neck supple.     Comments: No thyromegaly Cardiovascular:     Comments: S1-S2 normal no murmur Pulmonary:     Comments: Clear to percussion and auscultation Abdominal:     Palpations: Abdomen is soft.     Tenderness: There is no abdominal tenderness.     Comments: No hepatosplenomegaly  Lymphadenopathy:     Cervical: No cervical adenopathy.  Skin:    Comments: Mild swelling of his chin papulosquamous eruption in the right shoulder and right thorax  Neurological:     General: No focal deficit present.     Mental Status: He is alert and oriented to person, place,  and time.  Psychiatric:        Mood and Affect: Mood normal.        Behavior: Behavior normal.        Thought Content: Thought content normal.        Judgment: Judgment normal.     Diagnostics: FVC 2.91 L FEV1 2.43 L.  Predicted FVC 4.42 L predicted FEV1 3.43 L.  After albuterol 2 puffs FVC 3.23 L FEV1 2.84 L-this shows a mild reduction in the forced vital capacity with a 17% improvement in the FEV1 after albuterol  Allergy skin test were positive to some molds on intradermal testing only.  Skin testing to foods was negative  Assessment  Assessment and Plan: 1. Angioedema, subsequent encounter   2. Mild intermittent asthma without complication   3. Other allergic rhinitis   4. Chronic urticaria   5. Fatigue, unspecified type   6. Anaphylaxis, subsequent encounter   7. History of heart artery stent   8. S/P TKR (total knee replacement), bilateral   9. History of obstructive sleep apnea     Meds ordered this encounter  Medications  . montelukast (SINGULAIR) 10 MG tablet    Sig: Take 1 tablet once a day to prevent coughing or wheezing    Dispense:  30 tablet    Refill:  5  . EPINEPHrine (AUVI-Q) 0.3 mg/0.3 mL IJ SOAJ injection    Sig: Use as directed for severe allergic reactions    Dispense:  2 Device    Refill:  2  . albuterol (VENTOLIN HFA) 108 (90 Base) MCG/ACT inhaler    Sig: Inhale 2 puffs into the lungs every 4 (four) hours as needed for wheezing or shortness of breath.    Dispense:  1 Inhaler    Refill:  2  . DISCONTD: famotidine (PEPCID) 20 MG tablet    Sig: Take 1 tablet (20 mg total) by mouth 2 (two) times daily.    Dispense:  60 tablet    Refill:  5  . famotidine (PEPCID) 20 MG tablet    Sig: Take 1 tablet (20 mg total) by mouth 2 (two) times daily.    Dispense:  60 tablet    Refill:  5    Patient Instructions  Environmental control of dust and mold Zyrtec 10 mg-take 1 tablet twice a day for itching Pepcid 20 mg-take 1 tablet twice a day to help the  itching Montelukast 10 mg-take 1 tablet once a day to prevent coughing or wheezing.  It may help itching Ventolin 2 puffs every 4 hours if needed for wheezing or coughing spells.  You may use Ventolin 2 puffs 5 to 15 minutes before exercise If you have an allergic reaction , take Benadryl 50 mg every 4 hours and if you have life-threatening symptoms inject with AuviQ 0.3 mg.  Then write what you had to eat or drink in the previous 4 hours I will call you with the results of your lab work for chronic hives Prednisone 20 mg twice a day for 3 days, 20 mg on the fourth day, 10 mg on the fifth day to bring your allergic symptoms under control Do foods with salicylates make you itch? We will apply with your insurance company to use Xolair because of the severity of your hives Lotrimin 1% cream twice a day for 2 weeks in the area in the right shoulder where you have a rash   Return in about 4 weeks (around 05/23/2018).   Thank you for the opportunity to care for this patient.  Please do not hesitate to contact me with questions.  Penne Lash, M.D.  [facility] [address] [place], [place] [address] [phone]

## 2018-04-25 NOTE — Patient Instructions (Addendum)
Environmental control of dust and mold Zyrtec 10 mg-take 1 tablet twice a day for itching Pepcid 20 mg-take 1 tablet twice a day to help the itching Montelukast 10 mg-take 1 tablet once a day to prevent coughing or wheezing.  It may help itching Ventolin 2 puffs every 4 hours if needed for wheezing or coughing spells.  You may use Ventolin 2 puffs 5 to 15 minutes before exercise If you have an allergic reaction , take Benadryl 50 mg every 4 hours and if you have life-threatening symptoms inject with AuviQ 0.3 mg.  Then write what you had to eat or drink in the previous 4 hours I will call you with the results of your lab work for chronic hives Prednisone 20 mg twice a day for 3 days, 20 mg on the fourth day, 10 mg on the fifth day to bring your allergic symptoms under control Do foods with salicylates make you itch? We will apply with your insurance company to use Xolair because of the severity of your hives Lotrimin 1% cream twice a day for 2 weeks in the area in the right shoulder where you have a rash

## 2018-05-02 DIAGNOSIS — T782XXD Anaphylactic shock, unspecified, subsequent encounter: Secondary | ICD-10-CM | POA: Diagnosis not present

## 2018-05-02 DIAGNOSIS — R5383 Other fatigue: Secondary | ICD-10-CM | POA: Diagnosis not present

## 2018-05-02 DIAGNOSIS — L508 Other urticaria: Secondary | ICD-10-CM | POA: Diagnosis not present

## 2018-05-02 DIAGNOSIS — T783XXD Angioneurotic edema, subsequent encounter: Secondary | ICD-10-CM | POA: Diagnosis not present

## 2018-05-03 LAB — C4 COMPLEMENT: Complement C4, Serum: 41 mg/dL (ref 14–44)

## 2018-05-04 LAB — C1 ESTERASE INHIBITOR, FUNCTIONAL: C1INH Functional/C1INH Total MFr SerPl: >100 %mean normal

## 2018-05-04 LAB — C1 ESTERASE INHIBITOR: C1INH SerPl-mCnc: 36 mg/dL (ref 21–39)

## 2018-05-05 LAB — ANA W/REFLEX IF POSITIVE
Anti Nuclear Antibody(ANA): POSITIVE — AB
Centromere Ab Screen: <0.2 AI (ref 0.0–0.9)
ENA RNP Ab: >8 AI — ABNORMAL HIGH (ref 0.0–0.9)
ENA SM Ab Ser-aCnc: 0.2 AI (ref 0.0–0.9)
ENA SSA (RO) Ab: <0.2 AI (ref 0.0–0.9)
ENA SSB (LA) Ab: 4.9 AI — ABNORMAL HIGH (ref 0.0–0.9)
Scleroderma (Scl-70) (ENA) Antibody, IgG: 0.2 AI (ref 0.0–0.9)
dsDNA Ab: <1 IU/mL (ref 0–9)

## 2018-05-05 LAB — CBC WITH DIFFERENTIAL/PLATELET
BASOS ABS: 0 10*3/uL (ref 0.0–0.2)
Basos: 1 %
EOS (ABSOLUTE): 0.2 10*3/uL (ref 0.0–0.4)
Eos: 3 %
Hematocrit: 36.8 % — ABNORMAL LOW (ref 37.5–51.0)
Hemoglobin: 11.9 g/dL — ABNORMAL LOW (ref 13.0–17.7)
Immature Grans (Abs): 0 10*3/uL (ref 0.0–0.1)
Immature Granulocytes: 0 %
Lymphocytes Absolute: 1.4 10*3/uL (ref 0.7–3.1)
Lymphs: 23 %
MCH: 26.5 pg — ABNORMAL LOW (ref 26.6–33.0)
MCHC: 32.3 g/dL (ref 31.5–35.7)
MCV: 82 fL (ref 79–97)
Monocytes Absolute: 0.6 10*3/uL (ref 0.1–0.9)
Monocytes: 9 %
Neutrophils Absolute: 4 10*3/uL (ref 1.4–7.0)
Neutrophils: 64 %
PLATELETS: 283 10*3/uL (ref 150–450)
RBC: 4.49 x10E6/uL (ref 4.14–5.80)
RDW: 14.5 % (ref 11.6–15.4)
WBC: 6.3 10*3/uL (ref 3.4–10.8)

## 2018-05-05 LAB — ALPHA-GAL PANEL
Alpha Gal IgE*: <0.1 kU/L (ref <0.10)
BEEF CLASS INTERPRETATION: 0
Beef (Bos spp) IgE: <0.1 kU/L (ref <0.35)
Class Interpretation: 0
Class Interpretation: 0
Lamb/Mutton (Ovis spp) IgE: <0.1 kU/L (ref <0.35)
Pork (Sus spp) IgE: <0.1 kU/L (ref <0.35)

## 2018-05-05 LAB — SEDIMENTATION RATE: Sed Rate: 32 mm/hr — ABNORMAL HIGH (ref 0–30)

## 2018-05-05 LAB — COMPREHENSIVE METABOLIC PANEL
ALK PHOS: 100 IU/L (ref 39–117)
ALT: 17 IU/L (ref 0–44)
AST: 21 IU/L (ref 0–40)
Albumin/Globulin Ratio: 1.7 (ref 1.2–2.2)
Albumin: 4.3 g/dL (ref 3.8–4.8)
BUN/Creatinine Ratio: 10 (ref 10–24)
BUN: 13 mg/dL (ref 8–27)
Bilirubin Total: <0.2 mg/dL (ref 0.0–1.2)
CO2: 19 mmol/L — ABNORMAL LOW (ref 20–29)
Calcium: 9.8 mg/dL (ref 8.6–10.2)
Chloride: 105 mmol/L (ref 96–106)
Creatinine, Ser: 1.32 mg/dL — ABNORMAL HIGH (ref 0.76–1.27)
GFR calc Af Amer: 66 mL/min/{1.73_m2} (ref >59)
GFR calc non Af Amer: 57 mL/min/{1.73_m2} — ABNORMAL LOW (ref >59)
Globulin, Total: 2.6 g/dL (ref 1.5–4.5)
Glucose: 146 mg/dL — ABNORMAL HIGH (ref 65–99)
Potassium: 4.2 mmol/L (ref 3.5–5.2)
Sodium: 142 mmol/L (ref 134–144)
Total Protein: 6.9 g/dL (ref 6.0–8.5)

## 2018-05-05 LAB — TSH+FREE T4
Free T4: 0.77 ng/dL — ABNORMAL LOW (ref 0.82–1.77)
TSH: 1.5 u[IU]/mL (ref 0.450–4.500)

## 2018-05-14 DIAGNOSIS — Z96659 Presence of unspecified artificial knee joint: Secondary | ICD-10-CM | POA: Diagnosis not present

## 2018-05-14 DIAGNOSIS — G4733 Obstructive sleep apnea (adult) (pediatric): Secondary | ICD-10-CM | POA: Diagnosis not present

## 2018-06-07 ENCOUNTER — Ambulatory Visit (INDEPENDENT_AMBULATORY_CARE_PROVIDER_SITE_OTHER): Payer: 59 | Admitting: *Deleted

## 2018-06-07 ENCOUNTER — Encounter: Payer: Self-pay | Admitting: Allergy

## 2018-06-07 ENCOUNTER — Telehealth: Payer: Self-pay

## 2018-06-07 ENCOUNTER — Ambulatory Visit: Payer: 59 | Admitting: Allergy

## 2018-06-07 ENCOUNTER — Other Ambulatory Visit: Payer: Self-pay

## 2018-06-07 VITALS — BP 138/82 | HR 80 | Temp 97.6°F | Resp 16

## 2018-06-07 DIAGNOSIS — R0981 Nasal congestion: Secondary | ICD-10-CM | POA: Diagnosis not present

## 2018-06-07 DIAGNOSIS — J4520 Mild intermittent asthma, uncomplicated: Secondary | ICD-10-CM

## 2018-06-07 DIAGNOSIS — T783XXD Angioneurotic edema, subsequent encounter: Secondary | ICD-10-CM

## 2018-06-07 DIAGNOSIS — L501 Idiopathic urticaria: Secondary | ICD-10-CM

## 2018-06-07 DIAGNOSIS — L508 Other urticaria: Secondary | ICD-10-CM

## 2018-06-07 MED ORDER — CETIRIZINE HCL 10 MG PO TABS: 10.0000 mg | ORAL_TABLET | Freq: Two times a day (BID) | ORAL | 5 refills | Status: AC

## 2018-06-07 MED ORDER — FAMOTIDINE 20 MG PO TABS: 20.0000 mg | ORAL_TABLET | Freq: Two times a day (BID) | ORAL | 5 refills | Status: AC

## 2018-06-07 MED ORDER — OMALIZUMAB 150 MG ~~LOC~~ SOLR
300.0000 mg | SUBCUTANEOUS | Status: AC
  Administered 2018-06-07 – 2018-10-25 (×6): 300 mg via SUBCUTANEOUS

## 2018-06-07 MED ORDER — FLUTICASONE PROPIONATE 50 MCG/ACT NA SUSP: NASAL | 5 refills | Status: AC

## 2018-06-07 NOTE — Telephone Encounter (Signed)
-----   Message from Garnet Sierras, DO sent at 06/07/2018  2:27 PM EDT ----- Regarding: rheumatology referral Please place rheumatology referral. Reason: positive ANA, elevated SSB and RNP antibody.  Thank you.

## 2018-06-07 NOTE — Assessment & Plan Note (Signed)
Chronic urticaria since the teens which resurfaced the past 10 years. Only taking zyrtec 10mg  once daily. labwork showed some abnormalities in ANA, SSB and RNP antibody. No specific triggers noted. Still having daily hives.   Start Zyrtec 10mg  twice a day.  Start Pepcid 20mg  twice a day.    Start Xolair 300mg  SQ every 4 weeks injections.  For mild symptoms you can take over the counter antihistamines such as Benadryl and monitor symptoms closely. If symptoms worsen or if you have severe symptoms including breathing issues, throat closure, significant swelling, whole body hives, severe diarrhea and vomiting, lightheadedness then inject epinephrine and seek immediate medical care afterwards.  Keep track of hives. . Avoid the following potential triggers: alcohol, tight clothing, NSAIDs.  Marland Kitchen Refer to rheumatology for abnormal labs as above.

## 2018-06-07 NOTE — Patient Instructions (Addendum)
Hives/angioedema:  Start zyrtec 10mg  twice a day.  Start Pepcid 20mg  twice a day.    Start Xolair injections   For mild symptoms you can take over the counter antihistamines such as Benadryl and monitor symptoms closely. If symptoms worsen or if you have severe symptoms including breathing issues, throat closure, significant swelling, whole body hives, severe diarrhea and vomiting, lightheadedness then inject epinephrine and seek immediate medical care afterwards.  Keep track of hives. . Avoid the following potential triggers: alcohol, tight clothing, NSAIDs.   Nasal congestion:  Start flonase 1-2 sprays daily for nasal congestion.  Nasal saline spray (i.e., Simply Saline) or nasal saline lavage (i.e., NeilMed) is recommended as needed and prior to medicated nasal sprays.  Breathing:   May use albuterol rescue inhaler 2 puffs or nebulizer every 4 to 6 hours as needed for shortness of breath, chest tightness, coughing, and wheezing. May use albuterol rescue inhaler 2 puffs 5 to 15 minutes prior to strenuous physical activities. Monitor frequency of use.   Follow up 2 in months

## 2018-06-07 NOTE — Telephone Encounter (Signed)
Message sent to [facility] to refer patient

## 2018-06-07 NOTE — Assessment & Plan Note (Signed)
   May use albuterol rescue inhaler 2 puffs or nebulizer every 4 to 6 hours as needed for shortness of breath, chest tightness, coughing, and wheezing. May use albuterol rescue inhaler 2 puffs 5 to 15 minutes prior to strenuous physical activities. Monitor frequency of use.

## 2018-06-07 NOTE — Progress Notes (Signed)
Follow Up Note  RE: Jeremiah Casey MRN: [id number] DOB: 11/07/1955 Date of Office Visit: 06/07/2018  Referring provider: Janine Limbo, PA-C Primary care provider: Janine Limbo, PA-C  Chief Complaint: Follow-up (Allergy Shots)  History of Present Illness: I had the pleasure of seeing Jeremiah Casey for a follow up visit at the [facility] on 06/07/2018. He is a 63 y.o. male, who is being followed for angioedema, asthma, allergic rhinitis, urticaria. Today he is here for regular follow up visit. His previous allergy office visit was on 04/25/2018 with Dr. Shaune Leeks.   Hives/angioedema: Symptoms have not changed since the last visit. He had this issue as a teenager which then became less bothersome until about 10 years ago.  Still breaking out in hives daily. Had lip angioedema on Monday and took about 1 day to resolve. Only has the lip angioedema about once a month. No respiratory compromise with it. Denies taking NSAIDs.   No triggers noted. Denies any changes in diet, medications, personal care products. He has not been to see his PCP yet.   Currently on zyrtec 10mg  once a day, Singulair daily and only taking Pepcid as needed.   Asthma: Using albuterol once a week usually when he goes to bed due to inability to breathe from his nasal congestion.  He also uses a CPAP machine which is drying out his throat.   Assessment and Plan: Nikolos is a 63 y.o. male with: Chronic urticaria Chronic urticaria since the teens which resurfaced the past 10 years. Only taking zyrtec 10mg  once daily. labwork showed some abnormalities in ANA, SSB and RNP antibody. No specific triggers noted. Still having daily hives.   Start Zyrtec 10mg  twice a day.  Start Pepcid 20mg  twice a day.    Start Xolair 300mg  SQ every 4 weeks injections.  For mild symptoms you can take over the counter antihistamines such as Benadryl and monitor symptoms closely. If symptoms worsen or if you have severe symptoms  including breathing issues, throat closure, significant swelling, whole body hives, severe diarrhea and vomiting, lightheadedness then inject epinephrine and seek immediate medical care afterwards.  Keep track of hives. . Avoid the following potential triggers: alcohol, tight clothing, NSAIDs.  Marland Kitchen Refer to rheumatology for abnormal labs as above.   Angio-edema Only has lip angioedema about once a month.   See assessment and plan as above. The antihistamines and Xolair should also help with this symptom.   Nasal congestion Increased nasal congestion. Skin testing was only positive to mold in the past.  Start Flonase 1-2 sprays daily for nasal congestion.  Nasal saline spray (i.e., Simply Saline) or nasal saline lavage (i.e., NeilMed) is recommended as needed and prior to medicated nasal sprays.  Mild intermittent asthma without complication  May use albuterol rescue inhaler 2 puffs or nebulizer every 4 to 6 hours as needed for shortness of breath, chest tightness, coughing, and wheezing. May use albuterol rescue inhaler 2 puffs 5 to 15 minutes prior to strenuous physical activities. Monitor frequency of use.    Return in about 2 months (around 08/07/2018).  Meds ordered this encounter  Medications  . cetirizine (ZYRTEC) 10 MG tablet    Sig: Take 1 tablet (10 mg total) by mouth 2 (two) times daily.    Dispense:  60 tablet    Refill:  5  . famotidine (PEPCID) 20 MG tablet    Sig: Take 1 tablet (20 mg total) by mouth 2 (two) times daily.  Dispense:  60 tablet    Refill:  5  . fluticasone (FLONASE) 50 MCG/ACT nasal spray    Sig: 1-2 sprays daily for nasal congestion.    Dispense:  16 g    Refill:  5   Diagnostics: NONE.  Medication List:  Current Outpatient Medications  Medication Sig Dispense Refill  . albuterol (VENTOLIN HFA) 108 (90 Base) MCG/ACT inhaler Inhale 2 puffs into the lungs every 4 (four) hours as needed for wheezing or shortness of breath. 1 Inhaler 2  .  aspirin 81 MG tablet Take 81 mg by mouth daily.    Marland Kitchen atorvastatin (LIPITOR) 40 MG tablet Take 40 mg by mouth daily.    . Canagliflozin-metFORMIN HCl (INVOKAMET) 150-500 MG TABS     . Cholecalciferol (VITAMIN D3) 1.25 MG (50000 UT) CAPS     . doxazosin (CARDURA) 2 MG tablet     . EPINEPHrine (AUVI-Q) 0.3 mg/0.3 mL IJ SOAJ injection Use as directed for severe allergic reactions 2 Device 2  . famotidine (PEPCID) 20 MG tablet Take 1 tablet (20 mg total) by mouth 2 (two) times daily. 60 tablet 5  . fluticasone (FLONASE) 50 MCG/ACT nasal spray 1-2 sprays daily for nasal congestion. 16 g 5  . glucose blood (ONE TOUCH ULTRA TEST) test strip     . ibuprofen (ADVIL,MOTRIN) 600 MG tablet Take 1 tablet (600 mg total) by mouth every 6 (six) hours as needed. 30 tablet 0  . metFORMIN (GLUCOPHAGE) 500 MG tablet Take by mouth 2 (two) times daily with a meal.    . metoprolol tartrate (LOPRESSOR) 25 MG tablet Take 25 mg by mouth 2 (two) times daily.    . montelukast (SINGULAIR) 10 MG tablet Take 1 tablet once a day to prevent coughing or wheezing 30 tablet 5  . TRADJENTA 5 MG TABS tablet     . cetirizine (ZYRTEC) 10 MG tablet Take 1 tablet (10 mg total) by mouth 2 (two) times daily. 60 tablet 5   Current Facility-Administered Medications  Medication Dose Route Frequency Provider Last Rate Last Dose  . omalizumab Arvid Right) injection 300 mg  300 mg Subcutaneous Q28 days Garnet Sierras, DO   300 mg at 06/07/18 1128   Allergies: No Known Allergies I reviewed his past medical history, social history, family history, and environmental history and no significant changes have been reported from previous visit on 04/25/2018.  Review of Systems  Constitutional: Negative for appetite change, chills, fever and unexpected weight change.  HENT: Positive for congestion. Negative for rhinorrhea.   Eyes: Negative for itching.  Respiratory: Negative for cough, chest tightness, shortness of breath and wheezing.   Gastrointestinal:  Negative for abdominal pain.  Skin: Positive for rash.  Allergic/Immunologic: Positive for environmental allergies. Negative for food allergies.  Neurological: Negative for headaches.   Objective: BP 138/82 (BP Location: Left Arm, Patient Position: Sitting, Cuff Size: Large)   Pulse 80   Temp 97.6 F (36.4 C) (Oral)   Resp 16   SpO2 96%  There is no height or weight on file to calculate BMI. Physical Exam  Constitutional: He is oriented to person, place, and time. He appears well-developed and well-nourished.  HENT:  Head: Normocephalic and atraumatic.  Right Ear: External ear normal.  Left Ear: External ear normal.  Nose: Nose normal.  Mouth/Throat: Oropharynx is clear and moist.  Eyes: Conjunctivae and EOM are normal.  Neck: Neck supple.  Cardiovascular: Normal rate, regular rhythm and normal heart sounds. Exam reveals no gallop and no  friction rub.  No murmur heard. Pulmonary/Chest: Effort normal and breath sounds normal. He has no wheezes. He has no rales.  Neurological: He is alert and oriented to person, place, and time.  Skin: Skin is warm. Rash noted.  Quarter size hives on the lower back area.   Psychiatric: He has a normal mood and affect. His behavior is normal.  Nursing note and vitals reviewed.  Previous notes and tests were reviewed. The plan was reviewed with the patient/family, and all questions/concerned were addressed.  It was my pleasure to see Tanish today and participate in his care. Please feel free to contact me with any questions or concerns.  Sincerely,  Rexene Alberts, DO Allergy & Immunology  [facility] office: [phone] [facility] office: [phone]

## 2018-06-07 NOTE — Assessment & Plan Note (Signed)
Only has lip angioedema about once a month.   See assessment and plan as above. The antihistamines and Xolair should also help with this symptom.

## 2018-06-07 NOTE — Assessment & Plan Note (Signed)
Increased nasal congestion. Skin testing was only positive to mold in the past.  Start Flonase 1-2 sprays daily for nasal congestion.  Nasal saline spray (i.e., Simply Saline) or nasal saline lavage (i.e., NeilMed) is recommended as needed and prior to medicated nasal sprays.

## 2018-06-10 NOTE — Telephone Encounter (Signed)
Patients referral has been faxed to [facility] [place] with Dr Gerilyn Nestle on today. They will call the patient to schedule.  Thanks

## 2018-06-14 DIAGNOSIS — Z96659 Presence of unspecified artificial knee joint: Secondary | ICD-10-CM | POA: Diagnosis not present

## 2018-06-14 DIAGNOSIS — G4733 Obstructive sleep apnea (adult) (pediatric): Secondary | ICD-10-CM | POA: Diagnosis not present

## 2018-07-01 DIAGNOSIS — G4733 Obstructive sleep apnea (adult) (pediatric): Secondary | ICD-10-CM | POA: Diagnosis not present

## 2018-07-04 DIAGNOSIS — M47816 Spondylosis without myelopathy or radiculopathy, lumbar region: Secondary | ICD-10-CM | POA: Diagnosis not present

## 2018-07-04 DIAGNOSIS — M5416 Radiculopathy, lumbar region: Secondary | ICD-10-CM | POA: Diagnosis not present

## 2018-07-04 DIAGNOSIS — M6798 Unspecified disorder of synovium and tendon, other site: Secondary | ICD-10-CM | POA: Diagnosis not present

## 2018-07-05 ENCOUNTER — Other Ambulatory Visit: Payer: Self-pay

## 2018-07-05 ENCOUNTER — Ambulatory Visit (INDEPENDENT_AMBULATORY_CARE_PROVIDER_SITE_OTHER): Payer: 59

## 2018-07-05 DIAGNOSIS — L501 Idiopathic urticaria: Secondary | ICD-10-CM

## 2018-07-13 DIAGNOSIS — Z79899 Other long term (current) drug therapy: Secondary | ICD-10-CM | POA: Diagnosis not present

## 2018-07-13 DIAGNOSIS — E785 Hyperlipidemia, unspecified: Secondary | ICD-10-CM | POA: Diagnosis not present

## 2018-07-13 DIAGNOSIS — E559 Vitamin D deficiency, unspecified: Secondary | ICD-10-CM | POA: Diagnosis not present

## 2018-07-13 DIAGNOSIS — E118 Type 2 diabetes mellitus with unspecified complications: Secondary | ICD-10-CM | POA: Diagnosis not present

## 2018-07-14 DIAGNOSIS — Z96659 Presence of unspecified artificial knee joint: Secondary | ICD-10-CM | POA: Diagnosis not present

## 2018-07-14 DIAGNOSIS — M2550 Pain in unspecified joint: Secondary | ICD-10-CM | POA: Diagnosis not present

## 2018-07-14 DIAGNOSIS — G4733 Obstructive sleep apnea (adult) (pediatric): Secondary | ICD-10-CM | POA: Diagnosis not present

## 2018-07-14 DIAGNOSIS — R768 Other specified abnormal immunological findings in serum: Secondary | ICD-10-CM | POA: Diagnosis not present

## 2018-08-02 ENCOUNTER — Ambulatory Visit (INDEPENDENT_AMBULATORY_CARE_PROVIDER_SITE_OTHER): Payer: 59

## 2018-08-02 ENCOUNTER — Other Ambulatory Visit: Payer: Self-pay

## 2018-08-02 DIAGNOSIS — L501 Idiopathic urticaria: Secondary | ICD-10-CM | POA: Diagnosis not present

## 2018-08-15 ENCOUNTER — Other Ambulatory Visit: Payer: Self-pay | Admitting: *Deleted

## 2018-08-15 MED ORDER — MONTELUKAST SODIUM 10 MG PO TABS: ORAL_TABLET | ORAL | 0 refills | Status: DC

## 2018-08-30 ENCOUNTER — Other Ambulatory Visit: Payer: Self-pay

## 2018-08-30 ENCOUNTER — Ambulatory Visit (INDEPENDENT_AMBULATORY_CARE_PROVIDER_SITE_OTHER): Payer: 59

## 2018-08-30 DIAGNOSIS — L501 Idiopathic urticaria: Secondary | ICD-10-CM | POA: Diagnosis not present

## 2018-09-24 ENCOUNTER — Other Ambulatory Visit: Payer: Self-pay | Admitting: Pediatrics

## 2018-09-27 ENCOUNTER — Ambulatory Visit (INDEPENDENT_AMBULATORY_CARE_PROVIDER_SITE_OTHER): Payer: 59

## 2018-09-27 ENCOUNTER — Other Ambulatory Visit: Payer: Self-pay

## 2018-09-27 DIAGNOSIS — L501 Idiopathic urticaria: Secondary | ICD-10-CM

## 2018-10-25 ENCOUNTER — Ambulatory Visit (INDEPENDENT_AMBULATORY_CARE_PROVIDER_SITE_OTHER): Payer: 59

## 2018-10-25 ENCOUNTER — Other Ambulatory Visit: Payer: Self-pay

## 2018-10-25 DIAGNOSIS — L501 Idiopathic urticaria: Secondary | ICD-10-CM

## 2018-11-08 ENCOUNTER — Other Ambulatory Visit: Payer: Self-pay | Admitting: Pediatrics

## 2018-11-08 MED ORDER — MONTELUKAST SODIUM 10 MG PO TABS: ORAL_TABLET | ORAL | 0 refills | Status: DC

## 2018-11-08 NOTE — Telephone Encounter (Signed)
Sent 1 courtesy refill of montelukast. Pt needs ov for additional refills.

## 2018-11-08 NOTE — Telephone Encounter (Signed)
pt called to get refill of montelukast. Send to new pharmacy [facility] [address] in [place]

## 2018-11-22 ENCOUNTER — Ambulatory Visit: Payer: Self-pay

## 2018-12-06 ENCOUNTER — Other Ambulatory Visit: Payer: Self-pay

## 2018-12-06 MED ORDER — MONTELUKAST SODIUM 10 MG PO TABS: ORAL_TABLET | ORAL | 5 refills | Status: AC

## 2018-12-06 NOTE — Telephone Encounter (Signed)
RF for montelukast x1 with 5 refills at [facility]

## 2018-12-12 ENCOUNTER — Telehealth: Payer: Self-pay | Admitting: *Deleted

## 2018-12-12 NOTE — Telephone Encounter (Signed)
Pt called wondering if his records had been faxed over to [facility] yet. I checked with Elmyra Ricks our medical release rep and she stated she had not received a request. I faxed over all notes/procedures.

## 2018-12-13 DIAGNOSIS — R0981 Nasal congestion: Secondary | ICD-10-CM | POA: Diagnosis not present

## 2018-12-13 DIAGNOSIS — J4520 Mild intermittent asthma, uncomplicated: Secondary | ICD-10-CM | POA: Diagnosis not present

## 2018-12-13 DIAGNOSIS — L501 Idiopathic urticaria: Secondary | ICD-10-CM | POA: Diagnosis not present

## 2018-12-13 DIAGNOSIS — J45990 Exercise induced bronchospasm: Secondary | ICD-10-CM | POA: Diagnosis not present

## 2018-12-29 DIAGNOSIS — L501 Idiopathic urticaria: Secondary | ICD-10-CM | POA: Diagnosis not present

## 2019-01-16 DIAGNOSIS — Z79899 Other long term (current) drug therapy: Secondary | ICD-10-CM | POA: Diagnosis not present

## 2019-01-16 DIAGNOSIS — E559 Vitamin D deficiency, unspecified: Secondary | ICD-10-CM | POA: Diagnosis not present

## 2019-01-16 DIAGNOSIS — E118 Type 2 diabetes mellitus with unspecified complications: Secondary | ICD-10-CM | POA: Diagnosis not present

## 2019-01-16 DIAGNOSIS — E785 Hyperlipidemia, unspecified: Secondary | ICD-10-CM | POA: Diagnosis not present

## 2019-01-16 DIAGNOSIS — J4520 Mild intermittent asthma, uncomplicated: Secondary | ICD-10-CM | POA: Diagnosis not present

## 2019-01-26 DIAGNOSIS — L501 Idiopathic urticaria: Secondary | ICD-10-CM | POA: Diagnosis not present

## 2019-06-05 ENCOUNTER — Other Ambulatory Visit: Payer: Self-pay

## 2019-06-05 NOTE — Telephone Encounter (Signed)
Singulair denied at [facility]. Patient called and informed that he needed an appt. He now has new insurance and is seeing another allergist.

## 2019-06-06 IMAGING — DX DG HIP (WITH OR WITHOUT PELVIS) 2-3V*L*
4 series · 4 of 4 positions shown · non-contrast
Comparison: None.

CLINICAL DATA: 61-year-old male with left hip pain.  No trauma.

EXAM:
DG HIP (WITH OR WITHOUT PELVIS) 2-3V LEFT

[hip ap]
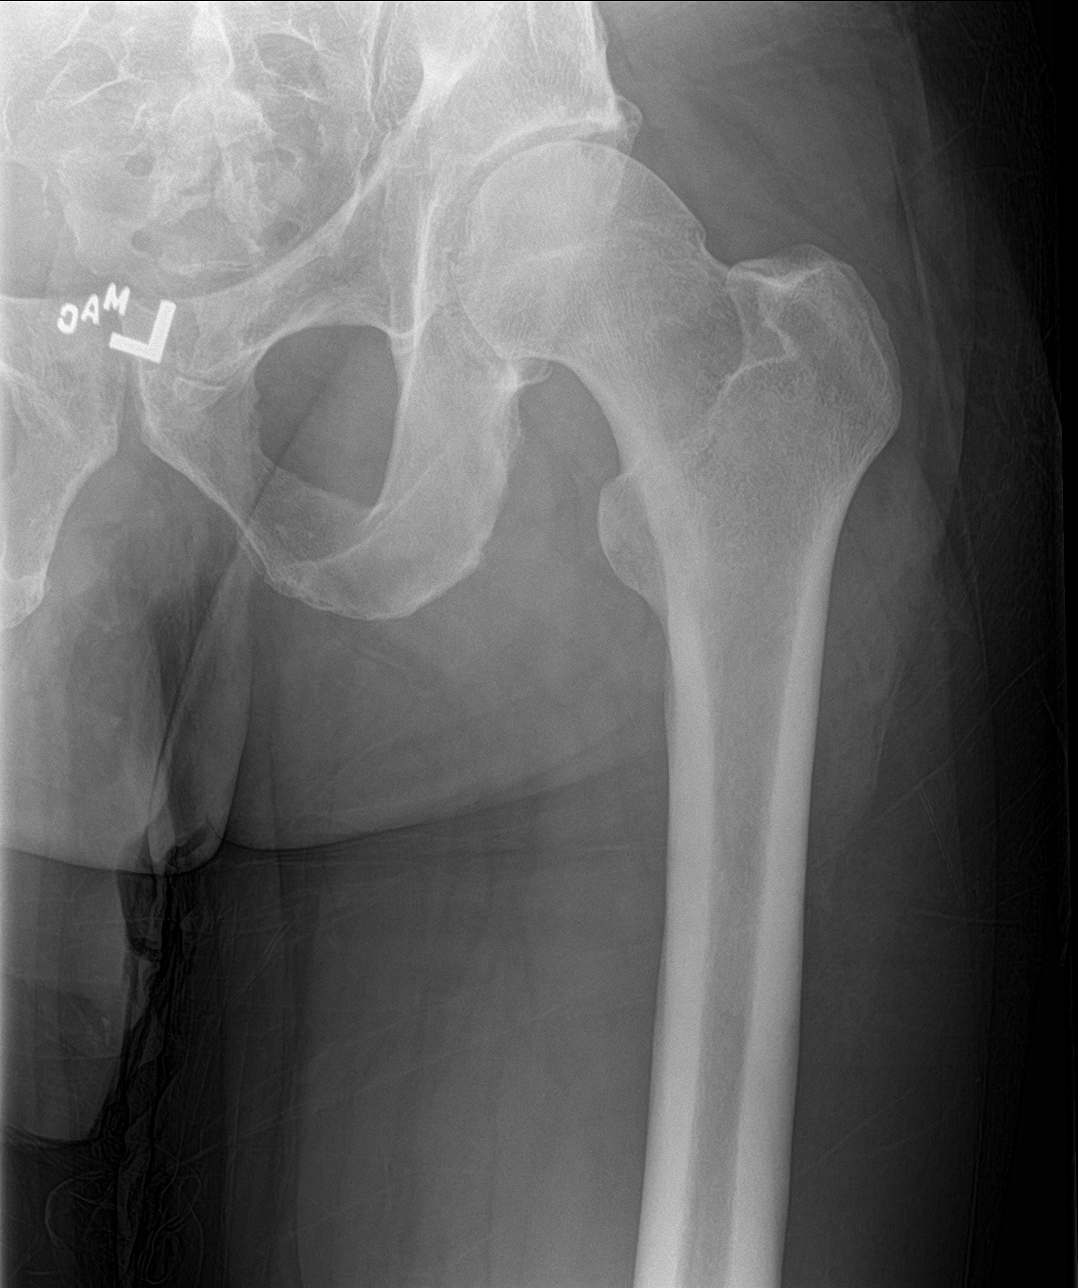

[hip lat]
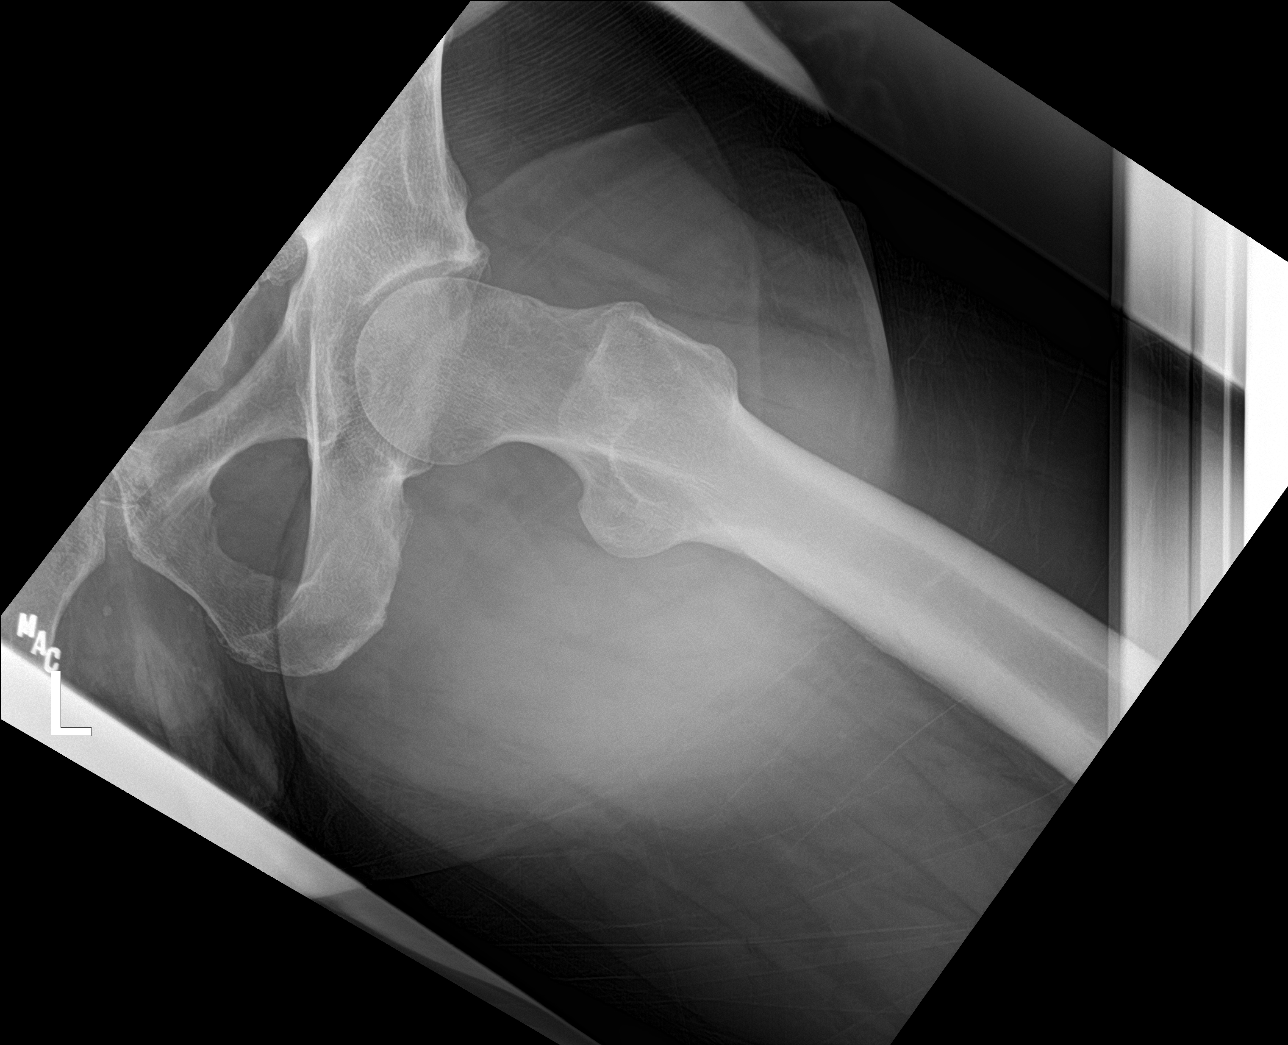

[pelvis ap (1 of 2)]
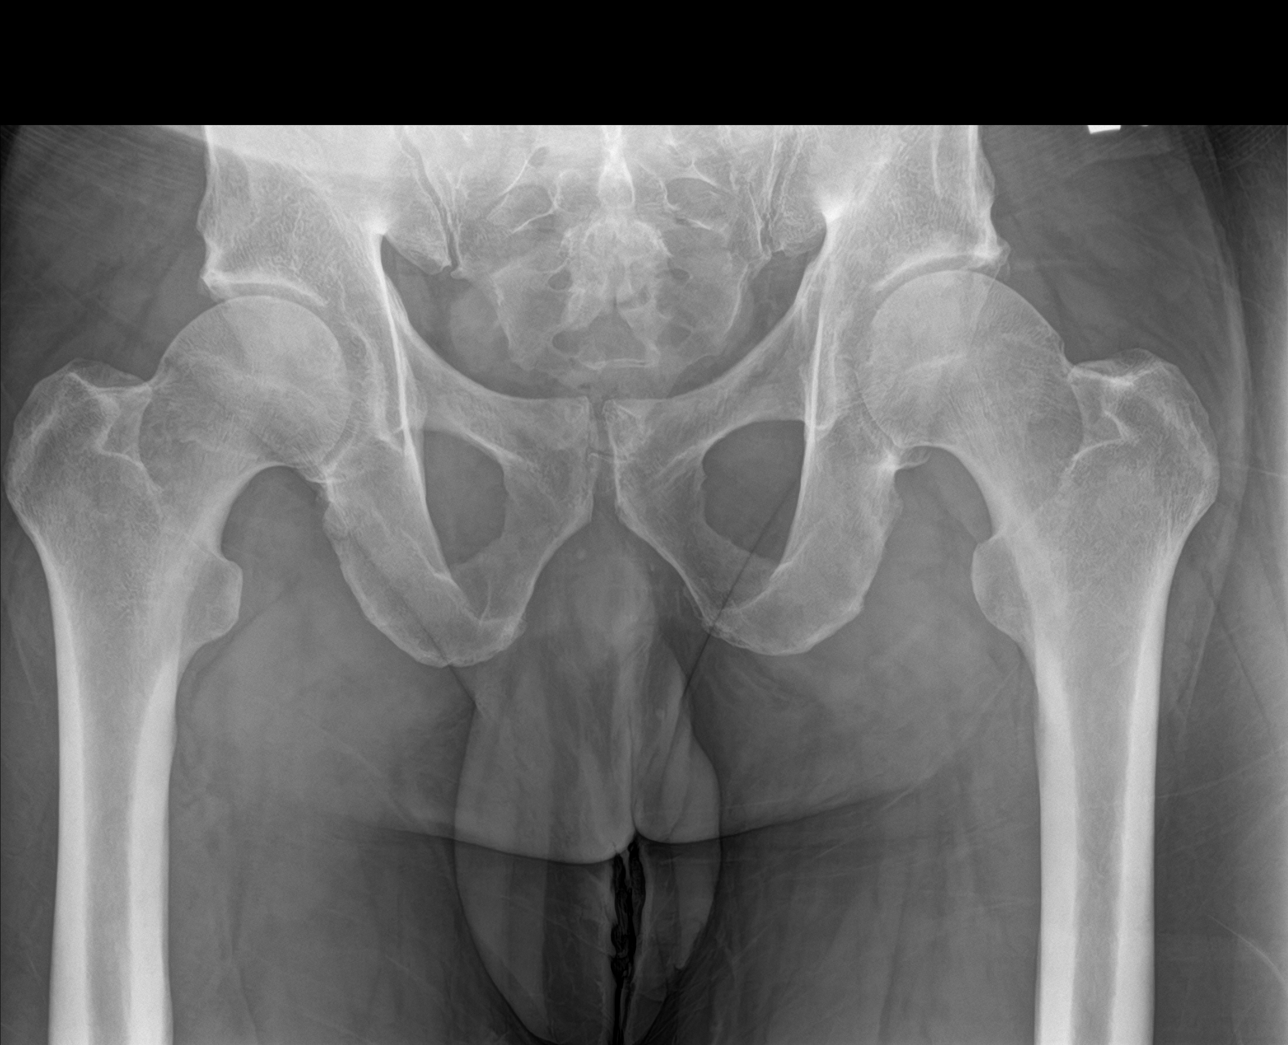

[pelvis ap (2 of 2)]
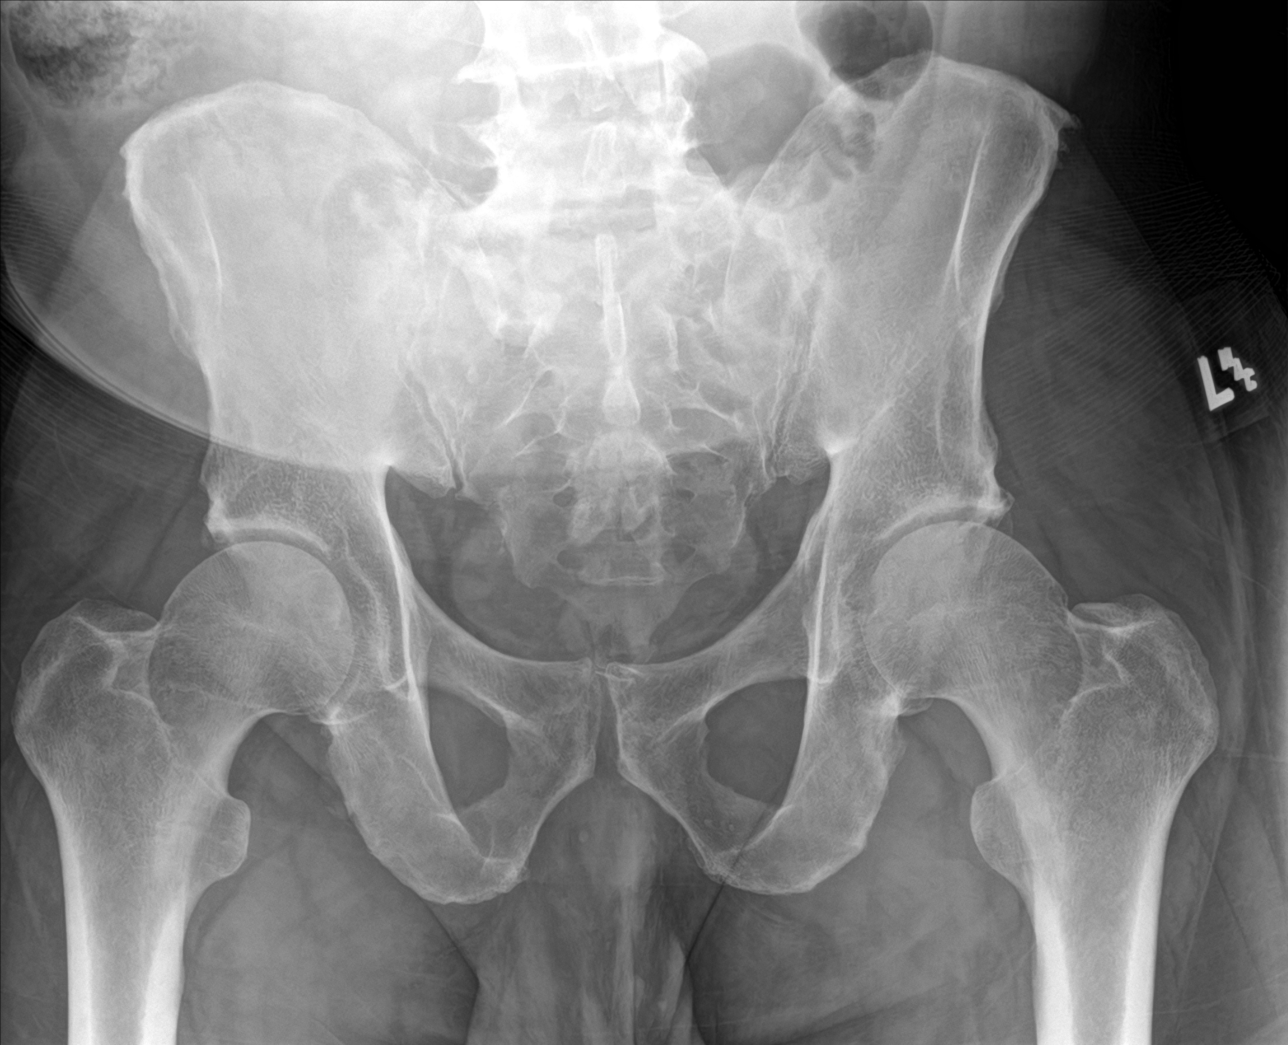

[4 of 4 positions shown; findings below may reference images not displayed]

FINDINGS: There is no acute fracture or dislocation. The bones are well
mineralized. Mild arthritic changes. There is aspherical morphology
of the femoral heads, left greater right, with a prominent anterior
superior cortex consistent with a CAM type morphology.
IMPRESSION: 1. No acute fracture or dislocation.
2. Findings concerning for femoroacetabular impingement secondary to
cam type femoral head morphology.

## 2021-01-07 DIAGNOSIS — L501 Idiopathic urticaria: Secondary | ICD-10-CM | POA: Diagnosis not present

## 2021-01-24 DIAGNOSIS — Z79899 Other long term (current) drug therapy: Secondary | ICD-10-CM | POA: Diagnosis not present

## 2021-01-24 DIAGNOSIS — E669 Obesity, unspecified: Secondary | ICD-10-CM | POA: Diagnosis not present

## 2021-01-24 DIAGNOSIS — I25119 Atherosclerotic heart disease of native coronary artery with unspecified angina pectoris: Secondary | ICD-10-CM | POA: Diagnosis not present

## 2021-01-24 DIAGNOSIS — E118 Type 2 diabetes mellitus with unspecified complications: Secondary | ICD-10-CM | POA: Diagnosis not present

## 2021-01-24 DIAGNOSIS — G4730 Sleep apnea, unspecified: Secondary | ICD-10-CM | POA: Diagnosis not present

## 2021-01-24 DIAGNOSIS — E559 Vitamin D deficiency, unspecified: Secondary | ICD-10-CM | POA: Diagnosis not present

## 2021-01-24 DIAGNOSIS — E785 Hyperlipidemia, unspecified: Secondary | ICD-10-CM | POA: Diagnosis not present

## 2021-01-24 DIAGNOSIS — Z6832 Body mass index (BMI) 32.0-32.9, adult: Secondary | ICD-10-CM | POA: Diagnosis not present

## 2021-02-04 DIAGNOSIS — L501 Idiopathic urticaria: Secondary | ICD-10-CM | POA: Diagnosis not present

## 2021-03-05 DIAGNOSIS — E669 Obesity, unspecified: Secondary | ICD-10-CM | POA: Diagnosis not present

## 2021-03-05 DIAGNOSIS — Z9181 History of falling: Secondary | ICD-10-CM | POA: Diagnosis not present

## 2021-03-05 DIAGNOSIS — E785 Hyperlipidemia, unspecified: Secondary | ICD-10-CM | POA: Diagnosis not present

## 2021-03-05 DIAGNOSIS — Z1331 Encounter for screening for depression: Secondary | ICD-10-CM | POA: Diagnosis not present

## 2021-03-05 DIAGNOSIS — Z0000 Encounter for general adult medical examination without abnormal findings: Secondary | ICD-10-CM | POA: Diagnosis not present

## 2021-03-11 DIAGNOSIS — L501 Idiopathic urticaria: Secondary | ICD-10-CM | POA: Diagnosis not present

## 2021-04-07 DIAGNOSIS — I2510 Atherosclerotic heart disease of native coronary artery without angina pectoris: Secondary | ICD-10-CM | POA: Diagnosis not present

## 2021-04-07 DIAGNOSIS — Z955 Presence of coronary angioplasty implant and graft: Secondary | ICD-10-CM | POA: Diagnosis not present

## 2021-04-07 DIAGNOSIS — E119 Type 2 diabetes mellitus without complications: Secondary | ICD-10-CM | POA: Diagnosis not present

## 2021-04-07 DIAGNOSIS — I10 Essential (primary) hypertension: Secondary | ICD-10-CM | POA: Diagnosis not present

## 2021-04-07 DIAGNOSIS — E785 Hyperlipidemia, unspecified: Secondary | ICD-10-CM | POA: Diagnosis not present

## 2021-04-10 DIAGNOSIS — L501 Idiopathic urticaria: Secondary | ICD-10-CM | POA: Diagnosis not present

## 2021-04-21 DIAGNOSIS — M5416 Radiculopathy, lumbar region: Secondary | ICD-10-CM | POA: Diagnosis not present

## 2021-04-25 DIAGNOSIS — Z79899 Other long term (current) drug therapy: Secondary | ICD-10-CM | POA: Diagnosis not present

## 2021-04-25 DIAGNOSIS — E1169 Type 2 diabetes mellitus with other specified complication: Secondary | ICD-10-CM | POA: Diagnosis not present

## 2021-04-25 DIAGNOSIS — I119 Hypertensive heart disease without heart failure: Secondary | ICD-10-CM | POA: Diagnosis not present

## 2021-04-25 DIAGNOSIS — E782 Mixed hyperlipidemia: Secondary | ICD-10-CM | POA: Diagnosis not present

## 2021-04-25 DIAGNOSIS — Z6832 Body mass index (BMI) 32.0-32.9, adult: Secondary | ICD-10-CM | POA: Diagnosis not present

## 2021-04-25 DIAGNOSIS — I25119 Atherosclerotic heart disease of native coronary artery with unspecified angina pectoris: Secondary | ICD-10-CM | POA: Diagnosis not present

## 2021-04-25 DIAGNOSIS — E785 Hyperlipidemia, unspecified: Secondary | ICD-10-CM | POA: Diagnosis not present

## 2021-04-25 DIAGNOSIS — E559 Vitamin D deficiency, unspecified: Secondary | ICD-10-CM | POA: Diagnosis not present

## 2021-05-08 DIAGNOSIS — L501 Idiopathic urticaria: Secondary | ICD-10-CM | POA: Diagnosis not present

## 2021-05-27 DIAGNOSIS — M7541 Impingement syndrome of right shoulder: Secondary | ICD-10-CM | POA: Diagnosis not present

## 2021-05-27 DIAGNOSIS — M25511 Pain in right shoulder: Secondary | ICD-10-CM | POA: Diagnosis not present

## 2021-06-10 DIAGNOSIS — J302 Other seasonal allergic rhinitis: Secondary | ICD-10-CM | POA: Diagnosis not present

## 2021-06-10 DIAGNOSIS — J4520 Mild intermittent asthma, uncomplicated: Secondary | ICD-10-CM | POA: Diagnosis not present

## 2021-06-10 DIAGNOSIS — L501 Idiopathic urticaria: Secondary | ICD-10-CM | POA: Diagnosis not present

## 2021-07-10 DIAGNOSIS — L501 Idiopathic urticaria: Secondary | ICD-10-CM | POA: Diagnosis not present

## 2021-07-25 DIAGNOSIS — E782 Mixed hyperlipidemia: Secondary | ICD-10-CM | POA: Diagnosis not present

## 2021-07-25 DIAGNOSIS — E669 Obesity, unspecified: Secondary | ICD-10-CM | POA: Diagnosis not present

## 2021-07-25 DIAGNOSIS — R0981 Nasal congestion: Secondary | ICD-10-CM | POA: Diagnosis not present

## 2021-07-25 DIAGNOSIS — E559 Vitamin D deficiency, unspecified: Secondary | ICD-10-CM | POA: Diagnosis not present

## 2021-07-25 DIAGNOSIS — E1169 Type 2 diabetes mellitus with other specified complication: Secondary | ICD-10-CM | POA: Diagnosis not present

## 2021-07-25 DIAGNOSIS — E785 Hyperlipidemia, unspecified: Secondary | ICD-10-CM | POA: Diagnosis not present

## 2021-07-25 DIAGNOSIS — Z79899 Other long term (current) drug therapy: Secondary | ICD-10-CM | POA: Diagnosis not present

## 2021-07-25 DIAGNOSIS — I119 Hypertensive heart disease without heart failure: Secondary | ICD-10-CM | POA: Diagnosis not present

## 2021-07-25 DIAGNOSIS — Z6832 Body mass index (BMI) 32.0-32.9, adult: Secondary | ICD-10-CM | POA: Diagnosis not present

## 2021-08-07 DIAGNOSIS — L501 Idiopathic urticaria: Secondary | ICD-10-CM | POA: Diagnosis not present

## 2021-09-15 DIAGNOSIS — L501 Idiopathic urticaria: Secondary | ICD-10-CM | POA: Diagnosis not present

## 2021-10-01 DIAGNOSIS — H5200 Hypermetropia, unspecified eye: Secondary | ICD-10-CM | POA: Diagnosis not present

## 2021-10-01 DIAGNOSIS — E7800 Pure hypercholesterolemia, unspecified: Secondary | ICD-10-CM | POA: Diagnosis not present

## 2021-10-01 DIAGNOSIS — Z0100 Encounter for examination of eyes and vision without abnormal findings: Secondary | ICD-10-CM | POA: Diagnosis not present

## 2021-10-01 DIAGNOSIS — E119 Type 2 diabetes mellitus without complications: Secondary | ICD-10-CM | POA: Diagnosis not present

## 2021-10-20 DIAGNOSIS — L501 Idiopathic urticaria: Secondary | ICD-10-CM | POA: Diagnosis not present

## 2021-10-28 DIAGNOSIS — E1169 Type 2 diabetes mellitus with other specified complication: Secondary | ICD-10-CM | POA: Diagnosis not present

## 2021-10-28 DIAGNOSIS — I25119 Atherosclerotic heart disease of native coronary artery with unspecified angina pectoris: Secondary | ICD-10-CM | POA: Diagnosis not present

## 2021-10-28 DIAGNOSIS — E559 Vitamin D deficiency, unspecified: Secondary | ICD-10-CM | POA: Diagnosis not present

## 2021-10-28 DIAGNOSIS — Z6831 Body mass index (BMI) 31.0-31.9, adult: Secondary | ICD-10-CM | POA: Diagnosis not present

## 2021-10-28 DIAGNOSIS — Z79899 Other long term (current) drug therapy: Secondary | ICD-10-CM | POA: Diagnosis not present

## 2021-10-28 DIAGNOSIS — Z125 Encounter for screening for malignant neoplasm of prostate: Secondary | ICD-10-CM | POA: Diagnosis not present

## 2021-10-28 DIAGNOSIS — E785 Hyperlipidemia, unspecified: Secondary | ICD-10-CM | POA: Diagnosis not present

## 2021-10-28 DIAGNOSIS — E782 Mixed hyperlipidemia: Secondary | ICD-10-CM | POA: Diagnosis not present

## 2021-10-28 DIAGNOSIS — I119 Hypertensive heart disease without heart failure: Secondary | ICD-10-CM | POA: Diagnosis not present

## 2021-10-28 DIAGNOSIS — Z139 Encounter for screening, unspecified: Secondary | ICD-10-CM | POA: Diagnosis not present

## 2021-11-18 DIAGNOSIS — L501 Idiopathic urticaria: Secondary | ICD-10-CM | POA: Diagnosis not present

## 2021-11-18 DIAGNOSIS — H43393 Other vitreous opacities, bilateral: Secondary | ICD-10-CM | POA: Diagnosis not present

## 2021-12-18 DIAGNOSIS — L501 Idiopathic urticaria: Secondary | ICD-10-CM | POA: Diagnosis not present

## 2022-01-20 DIAGNOSIS — L501 Idiopathic urticaria: Secondary | ICD-10-CM | POA: Diagnosis not present

## 2022-01-30 DIAGNOSIS — E785 Hyperlipidemia, unspecified: Secondary | ICD-10-CM | POA: Diagnosis not present

## 2022-01-30 DIAGNOSIS — Z6832 Body mass index (BMI) 32.0-32.9, adult: Secondary | ICD-10-CM | POA: Diagnosis not present

## 2022-01-30 DIAGNOSIS — Z79899 Other long term (current) drug therapy: Secondary | ICD-10-CM | POA: Diagnosis not present

## 2022-01-30 DIAGNOSIS — I119 Hypertensive heart disease without heart failure: Secondary | ICD-10-CM | POA: Diagnosis not present

## 2022-01-30 DIAGNOSIS — E782 Mixed hyperlipidemia: Secondary | ICD-10-CM | POA: Diagnosis not present

## 2022-01-30 DIAGNOSIS — E1169 Type 2 diabetes mellitus with other specified complication: Secondary | ICD-10-CM | POA: Diagnosis not present

## 2022-01-30 DIAGNOSIS — E559 Vitamin D deficiency, unspecified: Secondary | ICD-10-CM | POA: Diagnosis not present

## 2022-01-30 DIAGNOSIS — I25119 Atherosclerotic heart disease of native coronary artery with unspecified angina pectoris: Secondary | ICD-10-CM | POA: Diagnosis not present

## 2022-02-24 DIAGNOSIS — L501 Idiopathic urticaria: Secondary | ICD-10-CM | POA: Diagnosis not present

## 2022-03-16 DIAGNOSIS — Z8719 Personal history of other diseases of the digestive system: Secondary | ICD-10-CM | POA: Diagnosis not present

## 2022-03-16 DIAGNOSIS — Z86010 Personal history of colonic polyps: Secondary | ICD-10-CM | POA: Diagnosis not present

## 2022-03-16 DIAGNOSIS — K5140 Inflammatory polyps of colon without complications: Secondary | ICD-10-CM | POA: Diagnosis not present

## 2022-03-16 DIAGNOSIS — Z1211 Encounter for screening for malignant neoplasm of colon: Secondary | ICD-10-CM | POA: Diagnosis not present

## 2022-03-16 DIAGNOSIS — D123 Benign neoplasm of transverse colon: Secondary | ICD-10-CM | POA: Diagnosis not present

## 2022-03-16 DIAGNOSIS — K640 First degree hemorrhoids: Secondary | ICD-10-CM | POA: Diagnosis not present

## 2022-03-24 DIAGNOSIS — L501 Idiopathic urticaria: Secondary | ICD-10-CM | POA: Diagnosis not present

## 2022-04-27 DIAGNOSIS — L501 Idiopathic urticaria: Secondary | ICD-10-CM | POA: Diagnosis not present

## 2022-05-01 DIAGNOSIS — Z79899 Other long term (current) drug therapy: Secondary | ICD-10-CM | POA: Diagnosis not present

## 2022-05-01 DIAGNOSIS — Z1331 Encounter for screening for depression: Secondary | ICD-10-CM | POA: Diagnosis not present

## 2022-05-01 DIAGNOSIS — Z6832 Body mass index (BMI) 32.0-32.9, adult: Secondary | ICD-10-CM | POA: Diagnosis not present

## 2022-05-01 DIAGNOSIS — E1169 Type 2 diabetes mellitus with other specified complication: Secondary | ICD-10-CM | POA: Diagnosis not present

## 2022-05-01 DIAGNOSIS — E785 Hyperlipidemia, unspecified: Secondary | ICD-10-CM | POA: Diagnosis not present

## 2022-05-01 DIAGNOSIS — E559 Vitamin D deficiency, unspecified: Secondary | ICD-10-CM | POA: Diagnosis not present

## 2022-05-01 DIAGNOSIS — E782 Mixed hyperlipidemia: Secondary | ICD-10-CM | POA: Diagnosis not present

## 2022-05-01 DIAGNOSIS — Z9181 History of falling: Secondary | ICD-10-CM | POA: Diagnosis not present

## 2022-05-21 DIAGNOSIS — L501 Idiopathic urticaria: Secondary | ICD-10-CM | POA: Diagnosis not present

## 2022-06-22 DIAGNOSIS — L509 Urticaria, unspecified: Secondary | ICD-10-CM | POA: Diagnosis not present

## 2022-07-23 DIAGNOSIS — L501 Idiopathic urticaria: Secondary | ICD-10-CM | POA: Diagnosis not present

## 2022-07-28 DIAGNOSIS — Z135 Encounter for screening for eye and ear disorders: Secondary | ICD-10-CM | POA: Diagnosis not present

## 2022-07-28 DIAGNOSIS — H524 Presbyopia: Secondary | ICD-10-CM | POA: Diagnosis not present

## 2022-07-28 DIAGNOSIS — H43393 Other vitreous opacities, bilateral: Secondary | ICD-10-CM | POA: Diagnosis not present

## 2022-07-28 DIAGNOSIS — E119 Type 2 diabetes mellitus without complications: Secondary | ICD-10-CM | POA: Diagnosis not present

## 2022-07-28 DIAGNOSIS — H5203 Hypermetropia, bilateral: Secondary | ICD-10-CM | POA: Diagnosis not present

## 2022-07-28 DIAGNOSIS — H52223 Regular astigmatism, bilateral: Secondary | ICD-10-CM | POA: Diagnosis not present

## 2022-07-28 DIAGNOSIS — H31002 Unspecified chorioretinal scars, left eye: Secondary | ICD-10-CM | POA: Diagnosis not present

## 2022-07-31 DIAGNOSIS — I119 Hypertensive heart disease without heart failure: Secondary | ICD-10-CM | POA: Diagnosis not present

## 2022-07-31 DIAGNOSIS — E785 Hyperlipidemia, unspecified: Secondary | ICD-10-CM | POA: Diagnosis not present

## 2022-07-31 DIAGNOSIS — I25119 Atherosclerotic heart disease of native coronary artery with unspecified angina pectoris: Secondary | ICD-10-CM | POA: Diagnosis not present

## 2022-07-31 DIAGNOSIS — E559 Vitamin D deficiency, unspecified: Secondary | ICD-10-CM | POA: Diagnosis not present

## 2022-07-31 DIAGNOSIS — E1169 Type 2 diabetes mellitus with other specified complication: Secondary | ICD-10-CM | POA: Diagnosis not present

## 2022-07-31 DIAGNOSIS — E782 Mixed hyperlipidemia: Secondary | ICD-10-CM | POA: Diagnosis not present

## 2022-07-31 DIAGNOSIS — N400 Enlarged prostate without lower urinary tract symptoms: Secondary | ICD-10-CM | POA: Diagnosis not present

## 2022-07-31 DIAGNOSIS — Z6832 Body mass index (BMI) 32.0-32.9, adult: Secondary | ICD-10-CM | POA: Diagnosis not present

## 2022-07-31 DIAGNOSIS — Z79899 Other long term (current) drug therapy: Secondary | ICD-10-CM | POA: Diagnosis not present

## 2022-08-20 DIAGNOSIS — L509 Urticaria, unspecified: Secondary | ICD-10-CM | POA: Diagnosis not present

## 2022-09-17 DIAGNOSIS — L501 Idiopathic urticaria: Secondary | ICD-10-CM | POA: Diagnosis not present

## 2022-09-17 DIAGNOSIS — L509 Urticaria, unspecified: Secondary | ICD-10-CM | POA: Diagnosis not present

## 2022-10-20 DIAGNOSIS — L501 Idiopathic urticaria: Secondary | ICD-10-CM | POA: Diagnosis not present

## 2022-10-30 DIAGNOSIS — I119 Hypertensive heart disease without heart failure: Secondary | ICD-10-CM | POA: Diagnosis not present

## 2022-10-30 DIAGNOSIS — Z125 Encounter for screening for malignant neoplasm of prostate: Secondary | ICD-10-CM | POA: Diagnosis not present

## 2022-10-30 DIAGNOSIS — N400 Enlarged prostate without lower urinary tract symptoms: Secondary | ICD-10-CM | POA: Diagnosis not present

## 2022-10-30 DIAGNOSIS — E559 Vitamin D deficiency, unspecified: Secondary | ICD-10-CM | POA: Diagnosis not present

## 2022-10-30 DIAGNOSIS — G4730 Sleep apnea, unspecified: Secondary | ICD-10-CM | POA: Diagnosis not present

## 2022-10-30 DIAGNOSIS — N183 Chronic kidney disease, stage 3 unspecified: Secondary | ICD-10-CM | POA: Diagnosis not present

## 2022-10-30 DIAGNOSIS — I25119 Atherosclerotic heart disease of native coronary artery with unspecified angina pectoris: Secondary | ICD-10-CM | POA: Diagnosis not present

## 2022-10-30 DIAGNOSIS — E785 Hyperlipidemia, unspecified: Secondary | ICD-10-CM | POA: Diagnosis not present

## 2022-10-30 DIAGNOSIS — E782 Mixed hyperlipidemia: Secondary | ICD-10-CM | POA: Diagnosis not present

## 2022-10-30 DIAGNOSIS — Z6831 Body mass index (BMI) 31.0-31.9, adult: Secondary | ICD-10-CM | POA: Diagnosis not present

## 2022-10-30 DIAGNOSIS — E1169 Type 2 diabetes mellitus with other specified complication: Secondary | ICD-10-CM | POA: Diagnosis not present

## 2022-11-16 DIAGNOSIS — M7541 Impingement syndrome of right shoulder: Secondary | ICD-10-CM | POA: Diagnosis not present

## 2022-11-17 DIAGNOSIS — L501 Idiopathic urticaria: Secondary | ICD-10-CM | POA: Diagnosis not present

## 2022-12-17 DIAGNOSIS — L501 Idiopathic urticaria: Secondary | ICD-10-CM | POA: Diagnosis not present

## 2022-12-30 DIAGNOSIS — Z0000 Encounter for general adult medical examination without abnormal findings: Secondary | ICD-10-CM | POA: Diagnosis not present

## 2022-12-30 DIAGNOSIS — Z9181 History of falling: Secondary | ICD-10-CM | POA: Diagnosis not present

## 2023-01-26 DIAGNOSIS — G2581 Restless legs syndrome: Secondary | ICD-10-CM | POA: Diagnosis not present

## 2023-01-26 DIAGNOSIS — G4730 Sleep apnea, unspecified: Secondary | ICD-10-CM | POA: Diagnosis not present

## 2023-01-26 DIAGNOSIS — I25119 Atherosclerotic heart disease of native coronary artery with unspecified angina pectoris: Secondary | ICD-10-CM | POA: Diagnosis not present

## 2023-01-26 DIAGNOSIS — N183 Chronic kidney disease, stage 3 unspecified: Secondary | ICD-10-CM | POA: Diagnosis not present

## 2023-01-26 DIAGNOSIS — E782 Mixed hyperlipidemia: Secondary | ICD-10-CM | POA: Diagnosis not present

## 2023-01-26 DIAGNOSIS — L501 Idiopathic urticaria: Secondary | ICD-10-CM | POA: Diagnosis not present

## 2023-01-26 DIAGNOSIS — I119 Hypertensive heart disease without heart failure: Secondary | ICD-10-CM | POA: Diagnosis not present

## 2023-01-26 DIAGNOSIS — E1169 Type 2 diabetes mellitus with other specified complication: Secondary | ICD-10-CM | POA: Diagnosis not present

## 2023-01-26 DIAGNOSIS — N400 Enlarged prostate without lower urinary tract symptoms: Secondary | ICD-10-CM | POA: Diagnosis not present

## 2023-01-26 DIAGNOSIS — E785 Hyperlipidemia, unspecified: Secondary | ICD-10-CM | POA: Diagnosis not present

## 2023-03-02 DIAGNOSIS — L501 Idiopathic urticaria: Secondary | ICD-10-CM | POA: Diagnosis not present

## 2023-03-13 ENCOUNTER — Emergency Department (HOSPITAL_BASED_OUTPATIENT_CLINIC_OR_DEPARTMENT_OTHER)
Admission: EM | Admit: 2023-03-13 | Discharge: 2023-03-13 | Disposition: A | Discharge status: Home / Self Care | Payer: Medicare HMO | Attending: Emergency Medicine | Admitting: Emergency Medicine

## 2023-03-13 ENCOUNTER — Other Ambulatory Visit: Payer: Self-pay

## 2023-03-13 ENCOUNTER — Emergency Department (HOSPITAL_BASED_OUTPATIENT_CLINIC_OR_DEPARTMENT_OTHER): Payer: Medicare HMO

## 2023-03-13 DIAGNOSIS — I2510 Atherosclerotic heart disease of native coronary artery without angina pectoris: Secondary | ICD-10-CM | POA: Diagnosis not present

## 2023-03-13 DIAGNOSIS — R059 Cough, unspecified: Secondary | ICD-10-CM | POA: Diagnosis not present

## 2023-03-13 DIAGNOSIS — R202 Paresthesia of skin: Secondary | ICD-10-CM | POA: Diagnosis not present

## 2023-03-13 DIAGNOSIS — Z7982 Long term (current) use of aspirin: Secondary | ICD-10-CM | POA: Insufficient documentation

## 2023-03-13 DIAGNOSIS — J45909 Unspecified asthma, uncomplicated: Secondary | ICD-10-CM | POA: Insufficient documentation

## 2023-03-13 DIAGNOSIS — Z20822 Contact with and (suspected) exposure to covid-19: Secondary | ICD-10-CM | POA: Insufficient documentation

## 2023-03-13 DIAGNOSIS — E119 Type 2 diabetes mellitus without complications: Secondary | ICD-10-CM | POA: Insufficient documentation

## 2023-03-13 DIAGNOSIS — S5401XA Injury of ulnar nerve at forearm level, right arm, initial encounter: Secondary | ICD-10-CM | POA: Insufficient documentation

## 2023-03-13 DIAGNOSIS — Z7951 Long term (current) use of inhaled steroids: Secondary | ICD-10-CM | POA: Diagnosis not present

## 2023-03-13 DIAGNOSIS — J069 Acute upper respiratory infection, unspecified: Secondary | ICD-10-CM | POA: Insufficient documentation

## 2023-03-13 DIAGNOSIS — X58XXXA Exposure to other specified factors, initial encounter: Secondary | ICD-10-CM | POA: Diagnosis not present

## 2023-03-13 DIAGNOSIS — R519 Headache, unspecified: Secondary | ICD-10-CM | POA: Diagnosis not present

## 2023-03-13 DIAGNOSIS — R531 Weakness: Secondary | ICD-10-CM | POA: Insufficient documentation

## 2023-03-13 DIAGNOSIS — Z87891 Personal history of nicotine dependence: Secondary | ICD-10-CM | POA: Diagnosis not present

## 2023-03-13 DIAGNOSIS — B9789 Other viral agents as the cause of diseases classified elsewhere: Secondary | ICD-10-CM | POA: Diagnosis not present

## 2023-03-13 DIAGNOSIS — Z7984 Long term (current) use of oral hypoglycemic drugs: Secondary | ICD-10-CM | POA: Insufficient documentation

## 2023-03-13 DIAGNOSIS — S59911A Unspecified injury of right forearm, initial encounter: Secondary | ICD-10-CM | POA: Diagnosis present

## 2023-03-13 DIAGNOSIS — R9431 Abnormal electrocardiogram [ECG] [EKG]: Secondary | ICD-10-CM | POA: Diagnosis not present

## 2023-03-13 LAB — CBC
HCT: 36.5 % — ABNORMAL LOW (ref 39.0–52.0)
Hemoglobin: 11.6 g/dL — ABNORMAL LOW (ref 13.0–17.0)
MCH: 26.9 pg (ref 26.0–34.0)
MCHC: 31.8 g/dL (ref 30.0–36.0)
MCV: 84.7 fL (ref 80.0–100.0)
Platelets: 276 10*3/uL (ref 150–400)
RBC: 4.31 MIL/uL (ref 4.22–5.81)
RDW: 14.8 % (ref 11.5–15.5)
WBC: 8.4 10*3/uL (ref 4.0–10.5)
nRBC: 0 % (ref 0.0–0.2)

## 2023-03-13 LAB — RESP PANEL BY RT-PCR (RSV, FLU A&B, COVID)  RVPGX2
Influenza A by PCR: NEGATIVE
Influenza B by PCR: NEGATIVE
Resp Syncytial Virus by PCR: NEGATIVE
SARS Coronavirus 2 by RT PCR: NEGATIVE

## 2023-03-13 LAB — BASIC METABOLIC PANEL
Anion gap: 12 (ref 5–15)
BUN: 24 mg/dL — ABNORMAL HIGH (ref 8–23)
CO2: 22 mmol/L (ref 22–32)
Calcium: 9.5 mg/dL (ref 8.9–10.3)
Chloride: 104 mmol/L (ref 98–111)
Creatinine, Ser: 1.54 mg/dL — ABNORMAL HIGH (ref 0.61–1.24)
GFR, Estimated: 49 mL/min — ABNORMAL LOW (ref >60)
Glucose, Bld: 284 mg/dL — ABNORMAL HIGH (ref 70–99)
Potassium: 3.8 mmol/L (ref 3.5–5.1)
Sodium: 138 mmol/L (ref 135–145)

## 2023-03-13 MED ORDER — BENZONATATE 100 MG PO CAPS: 100.0000 mg | ORAL_CAPSULE | Freq: Three times a day (TID) | ORAL | 0 refills | Status: DC

## 2023-03-13 MED ORDER — PREDNISONE 20 MG PO TABS: 40.0000 mg | ORAL_TABLET | Freq: Every day | ORAL | 0 refills | Status: DC

## 2023-03-13 MED ORDER — PREDNISONE 20 MG PO TABS: 40.0000 mg | ORAL_TABLET | Freq: Every day | ORAL | 0 refills | Status: AC

## 2023-03-13 NOTE — Discharge Instructions (Addendum)
Please follow-up with your primary care doctor.  Your blood work is reassuring, there is no evidence of any kind of acute abnormalities.  I believe that you are suffering from a viral post URI symptom, which is causing the frequent cough/turning into bronchitis.  I prescribed you some prednisone, this may raise your blood sugar, but I will go down after you stop taking the prednisone.  Please take it in the morning, and not at night, as it will cause increased energy, and make it difficult to sleep.  Additionally I believe that you likely harmed your ulnar nerve, which is causing the numbness in your arm, as it is just from your elbow to your fingers.  Please follow-up with your doctor in regards to this.  If you start having chest pain, severe shortness of breath, please return to the ER

## 2023-03-13 NOTE — ED Provider Notes (Signed)
[facility] Provider Note   CSN: [id number] Arrival date & time: 03/13/23  1719     History  Chief Complaint  Patient presents with   Cough    Jeremiah Casey is a 68 y.o. male, history of CAD, asthma, diabetes who presents to the ED secondary to weakness, cough, and fatigue has been going on for the last couple weeks.  He states that he had a cold, and he has been able to shake it since then.  He is feels very weak, and has a frequent cough, that is nonproductive.  When he coughs, it causes him to have a headache.  Denies any chest pain, or shortness of breath however.  Has not been able to get over the illness.  Denies any urinary symptoms.  Also complains of some right arm, numbness.  He states from the right elbow, to his right pinky, it is numb.  Worse with certain movements.  Denies any known injuries.  Not have any chest pain, or shortness of breath.     Home Medications Prior to Admission medications   Medication Sig Start Date End Date Taking? Authorizing Provider  albuterol (VENTOLIN HFA) 108 (90 Base) MCG/ACT inhaler Inhale 2 puffs into the lungs every 4 (four) hours as needed for wheezing or shortness of breath. 04/25/18   [name], MD  aspirin 81 MG tablet Take 81 mg by mouth daily.    [provider]  atorvastatin (LIPITOR) 40 MG tablet Take 40 mg by mouth daily.    [provider]  benzonatate (TESSALON) 100 MG capsule Take 1 capsule (100 mg total) by mouth every 8 (eight) hours. 03/13/23   Tanaisha Pittman L, PA  Canagliflozin-metFORMIN HCl (INVOKAMET) 150-500 MG TABS  03/15/17   [provider]  cetirizine (ZYRTEC) 10 MG tablet Take 1 tablet (10 mg total) by mouth 2 (two) times daily. 06/07/18   [name], DO  Cholecalciferol (VITAMIN D3) 1.25 MG (50000 UT) CAPS  02/18/18   [provider]  doxazosin (CARDURA) 2 MG tablet  04/04/14   [provider]  EPINEPHrine (AUVI-Q) 0.3 mg/0.3  mL IJ SOAJ injection Use as directed for severe allergic reactions 04/25/18   [name], MD  famotidine (PEPCID) 20 MG tablet Take 1 tablet (20 mg total) by mouth 2 (two) times daily. 06/07/18   [name], DO  fluticasone (FLONASE) 50 MCG/ACT nasal spray 1-2 sprays daily for nasal congestion. 06/07/18   [name], DO  glucose blood (ONE TOUCH ULTRA TEST) test strip  06/14/14   [provider]  ibuprofen (ADVIL,MOTRIN) 600 MG tablet Take 1 tablet (600 mg total) by mouth every 6 (six) hours as needed. 08/18/17   Horton, Mayer Masker, MD  metFORMIN (GLUCOPHAGE) 500 MG tablet Take by mouth 2 (two) times daily with a meal.    [provider]  metoprolol tartrate (LOPRESSOR) 25 MG tablet Take 25 mg by mouth 2 (two) times daily.    [provider]  montelukast (SINGULAIR) 10 MG tablet Take 1 tablet once a day to prevent coughing or wheezing 12/06/18   [name], DO  predniSONE (DELTASONE) 20 MG tablet Take 2 tablets (40 mg total) by mouth daily for 5 days. 03/13/23 03/18/23  Janiyah Beery L, PA  TRADJENTA 5 MG TABS tablet  04/11/18   [provider]      Allergies    Patient has no known allergies.    Review  of Systems   Review of Systems  Respiratory:  Positive for cough. Negative for shortness of breath.     Physical Exam Updated Vital Signs BP 110/70   Pulse 87   Temp 97.9 F (36.6 C) (Oral)   Resp 18   Wt 111.1 kg   SpO2 97%   BMI 32.10 kg/m  Physical Exam Vitals and nursing note reviewed.  Constitutional:      General: He is not in acute distress.    Appearance: He is well-developed.  HENT:     Head: Normocephalic and atraumatic.  Eyes:     Conjunctiva/sclera: Conjunctivae normal.  Cardiovascular:     Rate and Rhythm: Normal rate and regular rhythm.     Heart sounds: No murmur heard. Pulmonary:     Effort: Pulmonary effort is normal. No respiratory distress.     Breath sounds: Normal breath sounds.  Abdominal:     Palpations: Abdomen  is soft.     Tenderness: There is no abdominal tenderness.  Musculoskeletal:        General: No swelling.     Cervical back: Neck supple.  Skin:    General: Skin is warm and dry.     Capillary Refill: Capillary refill takes less than 2 seconds.  Neurological:     Mental Status: He is alert.     Comments: +decreased sensation of 5th phalanx w/increased numbness of ulnar aspect of forearm and ttp of elbow.   Psychiatric:        Mood and Affect: Mood normal.     ED Results / Procedures / Treatments   Labs (all labs ordered are listed, but only abnormal results are displayed) Labs Reviewed  BASIC METABOLIC PANEL - Abnormal; Notable for the following components:      Result Value   Glucose, Bld 284 (*)    BUN 24 (*)    Creatinine, Ser 1.54 (*)    GFR, Estimated 49 (*)    All other components within normal limits  CBC - Abnormal; Notable for the following components:   Hemoglobin 11.6 (*)    HCT 36.5 (*)    All other components within normal limits  RESP PANEL BY RT-PCR (RSV, FLU A&B, COVID)  [id number]    EKG EKG Interpretation Date/Time:  Saturday March 13 2023 17:41:42 EST Ventricular Rate:  90 PR Interval:  179 QRS Duration:  126 QT Interval:  394 QTC Calculation: 483 R Axis:   -77  Text Interpretation: Sinus rhythm Nonspecific IVCD with LAD Consider anterior infarct No old tracing to compare Confirmed by Melene Plan [id number]) on 03/13/2023 5:43:03 PM  Radiology DG Chest 2 View Result Date: 03/13/2023 CLINICAL DATA:  Dry cough for 2 weeks, intermittent headache and weakness, history of tobacco abuse EXAM: CHEST - 2 VIEW COMPARISON:  03/03/2016 FINDINGS: Frontal and lateral views of the chest demonstrate an unremarkable cardiac silhouette. No acute airspace disease, effusion, or pneumothorax. No acute bony abnormalities. IMPRESSION: 1. No acute intrathoracic process. Electronically Signed   By: Sharlet Salina M.D.   On: 03/13/2023 18:10    Procedures Procedures     Medications Ordered in ED Medications - No data to display  ED Course/ Medical Decision Making/ A&P                                 Medical Decision Making Patient is a 68 year old male, here for 2 different issues 1 being weakness, after URI  symptoms.  With persistent cough, and headache.  On exam he is well-appearing, we will obtain chest x-ray, blood work for further evaluation.  Not have any urinary symptoms.  Not having chest pain, or shortness of breath, overall reassuring.  His lungs are clear to auscultation.  Additionally he is here with some right pinky pain, and numbness.  This has been going on for the last week, he has increased numbness to this area, goes from the elbow, down.  He has no neurodeficits on exam, he has equal grip strength, no drift.  I am suspicious for an ulnar nerve injury.  As it is very localized to one area, and it does not affect his whole extremity.  It is just from the elbow down.  He is not having chest pain or shortness of breath thus I think it is unlikely to be cardiac.   Amount and/or Complexity of Data Reviewed Labs: ordered.    Details: Unremarkable labs except for mild hyperglycemia of 284 Radiology: ordered.    Details: Chest x-ray is clear ECG/medicine tests:  Decision-making details documented in ED Course. Discussion of management or test interpretation with external provider(s): Discussed with patient, and daughter at bedside, blood work is reassuring, there are no acute findings, chest x-ray shows no acute findings either EKG shows sinus rhythm.  He is not have any chest pain, shortness of breath thus I think unlikely to be cardiac.  I believe this likely represents a postviral syndrome, or some type of bronchitis.  Will start him on prednisone, which I informed him will increase his glucose, he voiced understanding.  Advised to follow-up with PCP, return if symptoms worsen.  We also discussed the numbness, this pinky to his elbow.  I believe  this likely represents a nerve injury, given the localized region.  He has no neurodeficits on exam, other than the increased numbness to this area.  We discussed follow-up with PCP, and return precautions and he was discharged home  Risk Prescription drug management.     Final Clinical Impression(s) / ED Diagnoses Final diagnoses:  Viral URI with cough  Injury to ulnar nerve of right forearm, initial encounter    Rx / DC Orders ED Discharge Orders          Ordered    predniSONE (DELTASONE) 20 MG tablet  Daily,   Status:  Discontinued        03/13/23 1905    benzonatate (TESSALON) 100 MG capsule  Every 8 hours,   Status:  Discontinued        03/13/23 1905    benzonatate (TESSALON) 100 MG capsule  Every 8 hours        03/13/23 1906    predniSONE (DELTASONE) 20 MG tablet  Daily        03/13/23 1906              Pete Pelt, PA 03/13/23 1911    Melene Plan, DO 03/13/23 1920

## 2023-03-13 NOTE — ED Triage Notes (Addendum)
Pt reports HA, cough and feeling unsteady x 2 weeks Headache is intermittent. No HA at this time. Productive cough. "Just doesn't feel well' Denies N/V/D No pain Also complaining of numbness right hand along side of small finger. Has hx of back problems and has appt with DR neck week

## 2023-03-15 DIAGNOSIS — H8110 Benign paroxysmal vertigo, unspecified ear: Secondary | ICD-10-CM | POA: Diagnosis not present

## 2023-03-15 DIAGNOSIS — E782 Mixed hyperlipidemia: Secondary | ICD-10-CM | POA: Diagnosis not present

## 2023-03-15 DIAGNOSIS — R059 Cough, unspecified: Secondary | ICD-10-CM | POA: Diagnosis not present

## 2023-03-15 DIAGNOSIS — Z6832 Body mass index (BMI) 32.0-32.9, adult: Secondary | ICD-10-CM | POA: Diagnosis not present

## 2023-03-15 DIAGNOSIS — E1169 Type 2 diabetes mellitus with other specified complication: Secondary | ICD-10-CM | POA: Diagnosis not present

## 2023-03-29 ENCOUNTER — Encounter (HOSPITAL_BASED_OUTPATIENT_CLINIC_OR_DEPARTMENT_OTHER): Payer: Self-pay | Admitting: Urology

## 2023-03-29 ENCOUNTER — Observation Stay (HOSPITAL_BASED_OUTPATIENT_CLINIC_OR_DEPARTMENT_OTHER)
Admission: EM | Admit: 2023-03-29 | Discharge: 2023-03-30 | Disposition: A | Discharge status: Home / Self Care | Payer: Medicare HMO | Attending: Student | Admitting: Student

## 2023-03-29 ENCOUNTER — Other Ambulatory Visit: Payer: Self-pay

## 2023-03-29 ENCOUNTER — Emergency Department (HOSPITAL_BASED_OUTPATIENT_CLINIC_OR_DEPARTMENT_OTHER): Payer: Medicare HMO

## 2023-03-29 DIAGNOSIS — N1831 Chronic kidney disease, stage 3a: Secondary | ICD-10-CM | POA: Diagnosis present

## 2023-03-29 DIAGNOSIS — Z6831 Body mass index (BMI) 31.0-31.9, adult: Secondary | ICD-10-CM | POA: Insufficient documentation

## 2023-03-29 DIAGNOSIS — E1122 Type 2 diabetes mellitus with diabetic chronic kidney disease: Secondary | ICD-10-CM | POA: Diagnosis not present

## 2023-03-29 DIAGNOSIS — I2510 Atherosclerotic heart disease of native coronary artery without angina pectoris: Secondary | ICD-10-CM | POA: Diagnosis not present

## 2023-03-29 DIAGNOSIS — R531 Weakness: Principal | ICD-10-CM

## 2023-03-29 DIAGNOSIS — J4520 Mild intermittent asthma, uncomplicated: Secondary | ICD-10-CM | POA: Diagnosis present

## 2023-03-29 DIAGNOSIS — Z79899 Other long term (current) drug therapy: Secondary | ICD-10-CM | POA: Diagnosis not present

## 2023-03-29 DIAGNOSIS — Z7984 Long term (current) use of oral hypoglycemic drugs: Secondary | ICD-10-CM | POA: Insufficient documentation

## 2023-03-29 DIAGNOSIS — Z87891 Personal history of nicotine dependence: Secondary | ICD-10-CM | POA: Diagnosis not present

## 2023-03-29 DIAGNOSIS — Z1152 Encounter for screening for COVID-19: Secondary | ICD-10-CM | POA: Diagnosis not present

## 2023-03-29 DIAGNOSIS — Z955 Presence of coronary angioplasty implant and graft: Secondary | ICD-10-CM | POA: Insufficient documentation

## 2023-03-29 DIAGNOSIS — E669 Obesity, unspecified: Secondary | ICD-10-CM | POA: Diagnosis not present

## 2023-03-29 DIAGNOSIS — G4733 Obstructive sleep apnea (adult) (pediatric): Secondary | ICD-10-CM | POA: Diagnosis not present

## 2023-03-29 DIAGNOSIS — R0602 Shortness of breath: Secondary | ICD-10-CM | POA: Diagnosis not present

## 2023-03-29 DIAGNOSIS — E1165 Type 2 diabetes mellitus with hyperglycemia: Secondary | ICD-10-CM | POA: Diagnosis not present

## 2023-03-29 DIAGNOSIS — Z7982 Long term (current) use of aspirin: Secondary | ICD-10-CM | POA: Insufficient documentation

## 2023-03-29 DIAGNOSIS — I2699 Other pulmonary embolism without acute cor pulmonale: Principal | ICD-10-CM | POA: Diagnosis present

## 2023-03-29 DIAGNOSIS — J111 Influenza due to unidentified influenza virus with other respiratory manifestations: Secondary | ICD-10-CM | POA: Diagnosis not present

## 2023-03-29 DIAGNOSIS — R059 Cough, unspecified: Secondary | ICD-10-CM | POA: Diagnosis not present

## 2023-03-29 LAB — CBC WITH DIFFERENTIAL/PLATELET
Abs Immature Granulocytes: 0.02 10*3/uL (ref 0.00–0.07)
Basophils Absolute: 0 10*3/uL (ref 0.0–0.1)
Basophils Relative: 1 %
Eosinophils Absolute: 0.1 10*3/uL (ref 0.0–0.5)
Eosinophils Relative: 1 %
HCT: 36.2 % — ABNORMAL LOW (ref 39.0–52.0)
Hemoglobin: 11.6 g/dL — ABNORMAL LOW (ref 13.0–17.0)
Immature Granulocytes: 0 %
Lymphocytes Relative: 17 %
Lymphs Abs: 1.5 10*3/uL (ref 0.7–4.0)
MCH: 27.1 pg (ref 26.0–34.0)
MCHC: 32 g/dL (ref 30.0–36.0)
MCV: 84.6 fL (ref 80.0–100.0)
Monocytes Absolute: 0.8 10*3/uL (ref 0.1–1.0)
Monocytes Relative: 9 %
Neutro Abs: 6.5 10*3/uL (ref 1.7–7.7)
Neutrophils Relative %: 72 %
Platelets: 242 10*3/uL (ref 150–400)
RBC: 4.28 MIL/uL (ref 4.22–5.81)
RDW: 15 % (ref 11.5–15.5)
WBC: 8.9 10*3/uL (ref 4.0–10.5)
nRBC: 0 % (ref 0.0–0.2)

## 2023-03-29 LAB — BASIC METABOLIC PANEL
Anion gap: 12 (ref 5–15)
BUN: 22 mg/dL (ref 8–23)
CO2: 22 mmol/L (ref 22–32)
Calcium: 10 mg/dL (ref 8.9–10.3)
Chloride: 109 mmol/L (ref 98–111)
Creatinine, Ser: 1.48 mg/dL — ABNORMAL HIGH (ref 0.61–1.24)
GFR, Estimated: 52 mL/min — ABNORMAL LOW (ref >60)
Glucose, Bld: 190 mg/dL — ABNORMAL HIGH (ref 70–99)
Potassium: 4.1 mmol/L (ref 3.5–5.1)
Sodium: 143 mmol/L (ref 135–145)

## 2023-03-29 LAB — D-DIMER, QUANTITATIVE: D-Dimer, Quant: 0.74 ug{FEU}/mL — ABNORMAL HIGH (ref 0.00–0.50)

## 2023-03-29 LAB — CBG MONITORING, ED: Glucose-Capillary: 224 mg/dL — ABNORMAL HIGH (ref 70–99)

## 2023-03-29 LAB — RESP PANEL BY RT-PCR (RSV, FLU A&B, COVID)  RVPGX2
Influenza A by PCR: NEGATIVE
Influenza B by PCR: NEGATIVE
Resp Syncytial Virus by PCR: NEGATIVE
SARS Coronavirus 2 by RT PCR: NEGATIVE

## 2023-03-29 LAB — TROPONIN I (HIGH SENSITIVITY)
Troponin I (High Sensitivity): 4 ng/L (ref <18)
Troponin I (High Sensitivity): 4 ng/L (ref <18)

## 2023-03-29 MED ORDER — ONDANSETRON HCL 4 MG PO TABS: 4.0000 mg | ORAL_TABLET | Freq: Four times a day (QID) | ORAL | Status: DC | PRN

## 2023-03-29 MED ORDER — ACETAMINOPHEN 650 MG RE SUPP: 650.0000 mg | Freq: Four times a day (QID) | RECTAL | Status: DC | PRN

## 2023-03-29 MED ORDER — SENNOSIDES-DOCUSATE SODIUM 8.6-50 MG PO TABS: 1.0000 | ORAL_TABLET | Freq: Every evening | ORAL | Status: DC | PRN

## 2023-03-29 MED ORDER — ONDANSETRON HCL 4 MG/2ML IJ SOLN: 4.0000 mg | Freq: Four times a day (QID) | INTRAMUSCULAR | Status: DC | PRN

## 2023-03-29 MED ORDER — MONTELUKAST SODIUM 10 MG PO TABS: 10.0000 mg | ORAL_TABLET | Freq: Every day | ORAL | Status: DC

## 2023-03-29 MED ORDER — SODIUM CHLORIDE 0.9% FLUSH
3.0000 mL | Freq: Two times a day (BID) | INTRAVENOUS | Status: DC
  Administered 2023-03-30: 3 mL via INTRAVENOUS

## 2023-03-29 MED ORDER — OXYCODONE HCL 5 MG PO TABS
5.0000 mg | ORAL_TABLET | ORAL | Status: DC | PRN
Start: 2023-03-29 — End: 2023-03-30

## 2023-03-29 MED ORDER — DOXAZOSIN MESYLATE 2 MG PO TABS
2.0000 mg | ORAL_TABLET | Freq: Every day | ORAL | Status: DC
  Administered 2023-03-30: 2 mg via ORAL
  Filled 2023-03-29: qty 1

## 2023-03-29 MED ORDER — ATORVASTATIN CALCIUM 40 MG PO TABS
40.0000 mg | ORAL_TABLET | Freq: Every day | ORAL | Status: DC
  Administered 2023-03-30: 40 mg via ORAL
  Filled 2023-03-29: qty 1

## 2023-03-29 MED ORDER — INSULIN ASPART 100 UNIT/ML IJ SOLN: 0.0000 [IU] | Freq: Every day | INTRAMUSCULAR | Status: DC

## 2023-03-29 MED ORDER — INSULIN ASPART 100 UNIT/ML IJ SOLN
0.0000 [IU] | Freq: Three times a day (TID) | INTRAMUSCULAR | Status: DC
  Administered 2023-03-30: 2 [IU] via SUBCUTANEOUS

## 2023-03-29 MED ORDER — ACETAMINOPHEN 325 MG PO TABS: 650.0000 mg | ORAL_TABLET | Freq: Four times a day (QID) | ORAL | Status: DC | PRN

## 2023-03-29 MED ORDER — ALBUTEROL SULFATE (2.5 MG/3ML) 0.083% IN NEBU: 3.0000 mL | INHALATION_SOLUTION | RESPIRATORY_TRACT | Status: DC | PRN

## 2023-03-29 MED ORDER — IOHEXOL 350 MG/ML SOLN
100.0000 mL | Freq: Once | INTRAVENOUS | Status: AC | PRN
  Administered 2023-03-29: 80 mL via INTRAVENOUS

## 2023-03-29 MED ORDER — HEPARIN (PORCINE) 25000 UT/250ML-% IV SOLN
1900.0000 [IU]/h | INTRAVENOUS | Status: DC
  Administered 2023-03-29 – 2023-03-30 (×2): 1900 [IU]/h via INTRAVENOUS
  Filled 2023-03-29 (×2): qty 250

## 2023-03-29 MED ORDER — HEPARIN BOLUS VIA INFUSION
6500.0000 [IU] | Freq: Once | INTRAVENOUS | Status: AC
  Administered 2023-03-29: 6500 [IU] via INTRAVENOUS

## 2023-03-29 MED ORDER — SODIUM CHLORIDE 0.9 % IV BOLUS
1000.0000 mL | Freq: Once | INTRAVENOUS | Status: AC
Start: 2023-03-29 — End: 2023-03-29
  Administered 2023-03-29: 1000 mL via INTRAVENOUS

## 2023-03-29 NOTE — H&P (Signed)
History and Physical    Jeremiah Casey ZOX:[id number] DOB: Oct 13, 1955 DOA: 03/29/2023  PCP: Eunice Blase, PA-C   Patient coming from: Home   Chief Complaint: Generalized weakness, SOB, malaise   HPI: Jeremiah Casey is a 68 y.o. male with medical history significant for type 2 diabetes mellitus, CKD 3A, CAD, asthma, and history of provoked PE no longer anticoagulated who presents with generalized weakness, malaise, and shortness of breath.  Patient reports developing cough, headache, and general malaise close to 1 month ago.  He was seen in the ED for this on 03/13/2023 and prescribed prednisone and Tessalon.  Since then, the cough has resolved, but he continues to experience generalized weakness, nonspecific malaise, and exertional dyspnea.  He denies any lower extremity swelling or tenderness, denies chest pain, and denies any recent surgery or prolonged immobilization.  Patient states that he had a PE after knee surgery roughly 15 years ago, does not remember which anticoagulant he was on at that time, but did not have any bleeding complications and has since discontinued it.  [facility] ED Course: Upon arrival to the ED, patient is found to be afebrile and saturating well on room air with stable blood pressure and normal heart rate.  Labs are most notable for creatinine 1.48, normal WBC, negative respiratory virus panel, normal troponin x 2, and D-dimer 0.74.  CTA chest reveals acute PE within the right lower lobe pulmonary artery without evidence for right heart strain.  Patient was given a liter of saline, started on IV heparin infusion, and was transferred to [facility] for admission.  Review of Systems:  All other systems reviewed and apart from HPI, are negative.  Past Medical History:  Diagnosis Date   Coronary artery disease    Diabetes mellitus without complication (HCC)    Hyperlipemia    Multiple allergies     Past Surgical History:  Procedure Laterality Date   CORONARY  ANGIOPLASTY WITH STENT PLACEMENT     KNEE SURGERY      Social History:   reports that he quit smoking about 39 years ago. His smoking use included cigarettes. He has never used smokeless tobacco. He reports that he does not drink alcohol and does not use drugs.  No Known Allergies  Family History  Problem Relation Age of Onset   Asthma Sister    Eczema Brother    Urticaria Brother    Immunodeficiency Neg Hx    Allergic rhinitis Neg Hx    Angioedema Neg Hx      Prior to Admission medications   Medication Sig Start Date End Date Taking? Authorizing Provider  albuterol (VENTOLIN HFA) 108 (90 Base) MCG/ACT inhaler Inhale 2 puffs into the lungs every 4 (four) hours as needed for wheezing or shortness of breath. 04/25/18   [name], MD  aspirin 81 MG tablet Take 81 mg by mouth daily.    [provider]  atorvastatin (LIPITOR) 40 MG tablet Take 40 mg by mouth daily.    [provider]  benzonatate (TESSALON) 100 MG capsule Take 1 capsule (100 mg total) by mouth every 8 (eight) hours. 03/13/23   Small, Brooke L, PA  Canagliflozin-metFORMIN HCl (INVOKAMET) 150-500 MG TABS  03/15/17   [provider]  cetirizine (ZYRTEC) 10 MG tablet Take 1 tablet (10 mg total) by mouth 2 (two) times daily. 06/07/18   [name], DO  Cholecalciferol (VITAMIN D3) 1.25 MG (50000 UT) CAPS  02/18/18   [provider]  doxazosin (CARDURA)  2 MG tablet  04/04/14   [provider]  EPINEPHrine (AUVI-Q) 0.3 mg/0.3 mL IJ SOAJ injection Use as directed for severe allergic reactions 04/25/18   [name], MD  famotidine (PEPCID) 20 MG tablet Take 1 tablet (20 mg total) by mouth 2 (two) times daily. 06/07/18   [name], DO  fluticasone (FLONASE) 50 MCG/ACT nasal spray 1-2 sprays daily for nasal congestion. 06/07/18   [name], DO  glucose blood (ONE TOUCH ULTRA TEST) test strip  06/14/14   [provider]  ibuprofen (ADVIL,MOTRIN) 600 MG tablet Take 1 tablet  (600 mg total) by mouth every 6 (six) hours as needed. 08/18/17   Horton, Mayer Masker, MD  metFORMIN (GLUCOPHAGE) 500 MG tablet Take by mouth 2 (two) times daily with a meal.    [provider]  metoprolol tartrate (LOPRESSOR) 25 MG tablet Take 25 mg by mouth 2 (two) times daily.    [provider]  montelukast (SINGULAIR) 10 MG tablet Take 1 tablet once a day to prevent coughing or wheezing 12/06/18   [name], DO  TRADJENTA 5 MG TABS tablet  04/11/18   [provider]    Physical Exam: Vitals:   03/29/23 1216 03/29/23 1530 03/29/23 1742 03/29/23 1901  BP: 120/79 132/81  (!) 140/95  Pulse: 81 70  71  Resp: 16 (!) 21    Temp: 98.3 F (36.8 C)  98.4 F (36.9 C) 98.3 F (36.8 C)  TempSrc: Oral  Oral Oral  SpO2: 99% 100%  100%  Weight:      Height:        Constitutional: NAD, calm  Eyes: PERTLA, lids and conjunctivae normal ENMT: Mucous membranes are moist. Posterior pharynx clear of any exudate or lesions.   Neck: supple, no masses  Respiratory: no wheezing, no crackles. No accessory muscle use.  Cardiovascular: S1 & S2 heard, regular rate and rhythm. No extremity edema.  Abdomen: No distension, no tenderness, soft. Bowel sounds active.  Musculoskeletal: no clubbing / cyanosis. No joint deformity upper and lower extremities.   Skin: no significant rashes, lesions, ulcers. Warm, dry, well-perfused. Neurologic: CN 2-12 grossly intact. Moving all extremities. Alert and oriented.  Psychiatric: Pleasant. Cooperative.    Labs and Imaging on Admission: I have personally reviewed following labs and imaging studies  CBC: Recent Labs  Lab 03/29/23 1350  WBC 8.9  NEUTROABS 6.5  HGB 11.6*  HCT 36.2*  MCV 84.6  PLT 242   Basic Metabolic Panel: Recent Labs  Lab 03/29/23 1350  NA 143  K 4.1  CL 109  CO2 22  GLUCOSE 190*  BUN 22  CREATININE 1.48*  CALCIUM 10.0   GFR: Estimated Creatinine Clearance: 64.2 mL/min (A) (by C-G formula based on SCr of  1.48 mg/dL (H)). Liver Function Tests: No results for input(s): "AST", "ALT", "ALKPHOS", "BILITOT", "PROT", "ALBUMIN" in the last 168 hours. No results for input(s): "LIPASE", "AMYLASE" in the last 168 hours. No results for input(s): "AMMONIA" in the last 168 hours. Coagulation Profile: No results for input(s): "INR", "PROTIME" in the last 168 hours. Cardiac Enzymes: No results for input(s): "CKTOTAL", "CKMB", "CKMBINDEX", "TROPONINI" in the last 168 hours. BNP (last 3 results) No results for input(s): "PROBNP" in the last 8760 hours. HbA1C: No results for input(s): "HGBA1C" in the last 72 hours. CBG: Recent Labs  Lab 03/29/23 1219  GLUCAP 224*   Lipid Profile: No results for input(s): "CHOL", "HDL", "LDLCALC", "TRIG", "CHOLHDL", "LDLDIRECT" in the last 72  hours. Thyroid Function Tests: No results for input(s): "TSH", "T4TOTAL", "FREET4", "T3FREE", "THYROIDAB" in the last 72 hours. Anemia Panel: No results for input(s): "VITAMINB12", "FOLATE", "FERRITIN", "TIBC", "IRON", "RETICCTPCT" in the last 72 hours. Urine analysis: No results found for: "COLORURINE", "APPEARANCEUR", "LABSPEC", "PHURINE", "GLUCOSEU", "HGBUR", "BILIRUBINUR", "KETONESUR", "PROTEINUR", "UROBILINOGEN", "NITRITE", "LEUKOCYTESUR" Sepsis Labs: @LABRCNTIP (procalcitonin:4,lacticidven:4) ) Recent Results (from the past 240 hours)  Resp panel by RT-PCR (RSV, Flu A&B, Covid) Anterior Nasal Swab     Status: None   Collection Time: 03/29/23 12:17 PM   Specimen: Anterior Nasal Swab  Result Value Ref Range Status   SARS Coronavirus 2 by RT PCR NEGATIVE NEGATIVE Final    Comment: (NOTE) SARS-CoV-2 target nucleic acids are NOT DETECTED.  The SARS-CoV-2 RNA is generally detectable in upper respiratory specimens during the acute phase of infection. The lowest concentration of SARS-CoV-2 viral copies this assay can detect is 138 copies/mL. A negative result does not preclude SARS-Cov-2 infection and should not be used as  the sole basis for treatment or other patient management decisions. A negative result may occur with  improper specimen collection/handling, submission of specimen other than nasopharyngeal swab, presence of viral mutation(s) within the areas targeted by this assay, and inadequate number of viral copies(<138 copies/mL). A negative result must be combined with clinical observations, patient history, and epidemiological information. The expected result is Negative.  Fact Sheet for Patients:  BloggerCourse.com  Fact Sheet for Healthcare Providers:  SeriousBroker.it  This test is no t yet approved or cleared by the [place] FDA and  has been authorized for detection and/or diagnosis of SARS-CoV-2 by FDA under an Emergency Use Authorization (EUA). This EUA will remain  in effect (meaning this test can be used) for the duration of the COVID-19 declaration under Section 564(b)(1) of the Act, 21 U.S.C.section 360bbb-3(b)(1), unless the authorization is terminated  or revoked sooner.       Influenza A by PCR NEGATIVE NEGATIVE Final   Influenza B by PCR NEGATIVE NEGATIVE Final    Comment: (NOTE) The Xpert Xpress SARS-CoV-2/FLU/RSV plus assay is intended as an aid in the diagnosis of influenza from Nasopharyngeal swab specimens and should not be used as a sole basis for treatment. Nasal washings and aspirates are unacceptable for Xpert Xpress SARS-CoV-2/FLU/RSV testing.  Fact Sheet for Patients: BloggerCourse.com  Fact Sheet for Healthcare Providers: SeriousBroker.it  This test is not yet approved or cleared by the [place] FDA and has been authorized for detection and/or diagnosis of SARS-CoV-2 by FDA under an Emergency Use Authorization (EUA). This EUA will remain in effect (meaning this test can be used) for the duration of the COVID-19 declaration under Section 564(b)(1) of  the Act, 21 U.S.C. section 360bbb-3(b)(1), unless the authorization is terminated or revoked.     Resp Syncytial Virus by PCR NEGATIVE NEGATIVE Final    Comment: (NOTE) Fact Sheet for Patients: BloggerCourse.com  Fact Sheet for Healthcare Providers: SeriousBroker.it  This test is not yet approved or cleared by the [place] FDA and has been authorized for detection and/or diagnosis of SARS-CoV-2 by FDA under an Emergency Use Authorization (EUA). This EUA will remain in effect (meaning this test can be used) for the duration of the COVID-19 declaration under Section 564(b)(1) of the Act, 21 U.S.C. section 360bbb-3(b)(1), unless the authorization is terminated or revoked.  Performed at [facility], [address] [place], [place] [address]      Radiological Exams on Admission: CT Angio Chest PE W and/or Wo  Contrast Addendum Date: 03/29/2023 ADDENDUM REPORT: 03/29/2023 17:09 ADDENDUM: Critical Value/emergent results were called by telephone at the time of interpretation on 03/29/2023 at 5:09 pm to provider ADAM CURATOLO , who verbally acknowledged these results. Electronically Signed   By: Genevive Bi M.D.   On: 03/29/2023 17:09   Result Date: 03/29/2023 CLINICAL DATA:  Short of breath. Concern for pulmonary embolism. Flu like symptoms. EXAM: CT ANGIOGRAPHY CHEST WITH CONTRAST TECHNIQUE: Multidetector CT imaging of the chest was performed using the standard protocol during bolus administration of intravenous contrast. Multiplanar CT image reconstructions and MIPs were obtained to evaluate the vascular anatomy. RADIATION DOSE REDUCTION: This exam was performed according to the departmental dose-optimization program which includes automated exposure control, adjustment of the mA and/or kV according to patient size and/or use of iterative reconstruction technique. CONTRAST:  80mL OMNIPAQUE IOHEXOL 350 MG/ML SOLN COMPARISON:   None Available. FINDINGS: Cardiovascular: There is a tubular filling defect within the RIGHT lower lobe pulmonary artery. Filling defect is in distal segmental branch and extends into the subsegmental branches of the RIGHT lower lobe (image 78 through 90 of series 302). No additional filling defects within the pulmonary arteries. No CT evidence of RIGHT ventricular strain with the RIGHT ventricular diameter to LEFT ventricular diameter ratio less than 1. Coronary artery calcification and aortic atherosclerotic calcification. Mediastinum/Nodes: No axillary or supraclavicular adenopathy. No mediastinal or hilar adenopathy. No pericardial fluid. Esophagus normal. Lungs/Pleura: No pulmonary infarction. No pneumonia. No pleural fluid. No pneumothorax Upper Abdomen: Limited view of the liver, kidneys, pancreas are unremarkable. Normal adrenal glands. Musculoskeletal: No aggressive osseous lesion. Review of the MIP images confirms the above findings. IMPRESSION: 1. Acute pulmonary embolism within the RIGHT lower lobe pulmonary artery. Overall clot burden is mild. 2. No evidence of RIGHT ventricular strain. 3. No pulmonary infarction Electronically Signed: By: Genevive Bi M.D. On: 03/29/2023 16:48   DG Chest Portable 1 View Result Date: 03/29/2023 CLINICAL DATA:  Weakness cough EXAM: PORTABLE CHEST 1 VIEW COMPARISON:  03/13/2023 FINDINGS: The heart size and mediastinal contours are within normal limits. Both lungs are clear. The visualized skeletal structures are unremarkable. IMPRESSION: No active disease. Electronically Signed   By: Jasmine Pang M.D.   On: 03/29/2023 15:38    EKG: Independently reviewed. Sinus rhythm.   Assessment/Plan  1. Acute PE  - No heart strain on CT; cardiac enzymes normal; vitals normal on room air  - Continue IV heparin for now, follow-up on LE venous Doppers    2. CAD  - No anginal symptoms  - Lipitor   3. Type II DM  - Check CBGs and use low-intensity SSI for now    4.  Asthma; OSA  - Not in exacerbation  - Continue Singulair, CPAP while sleeping, and as-needed albuterol   5. CKD 3A  - Appears close to baseline  - Renally-dose medications    DVT prophylaxis: IV heparin  Code Status: Full  Level of Care: Level of care: Telemetry Medical Family Communication: Wife at bedside   Disposition Plan:  Patient is from: home  Anticipated d/c is to: home  Anticipated d/c date is: 03/31/23  Patient currently: Pending transition to oral anticoagulant  Consults called: None  Admission status: Inpatient     Briscoe Deutscher, MD [facility]  03/29/2023, 11:24 PM

## 2023-03-29 NOTE — ED Notes (Signed)
 Called [facility] for transport.

## 2023-03-29 NOTE — ED Provider Notes (Signed)
  Physical Exam  BP 132/81   Pulse 70   Temp 98.3 F (36.8 C) (Oral)   Resp (!) 21   Ht 6\' 2"  (1.88 m)   Wt 111 kg   SpO2 100%   BMI 31.42 kg/m   Physical Exam  Procedures  Procedures  ED Course / MDM    Medical Decision Making Amount and/or Complexity of Data Reviewed Labs: ordered. Radiology: ordered.  Risk Prescription drug management.   ***

## 2023-03-29 NOTE — ED Notes (Signed)
ED TO INPATIENT HANDOFF REPORT  ED Nurse Name and Phone #: Virgene Tirone RN [phone]  S Name/Age/Gender Jeremiah Casey 68 y.o. male Room/Bed: MH10/MH10  Code Status   Code Status: Not on file  Home/SNF/Other Home Patient oriented to: self, place, time, and situation Is this baseline? Yes   Triage Complete: Triage complete  Chief Complaint Acute pulmonary embolus (HCC) [I26.99]  Triage Note Pt states continued flu like symptoms and weakness.  States no taste or smell at this time  Denies fever, denies any pain at this time    H/o DM     Allergies No Known Allergies  Level of Care/Admitting Diagnosis ED Disposition     ED Disposition  Admit   Condition  --   Comment  Hospital Area: [facility] [[id number]]  Level of Care: Telemetry Medical [[id number]]  May admit patient to [facility] or [facility] if equivalent level of care is available:: Yes  Interfacility transfer: Yes  Covid Evaluation: Confirmed COVID Negative  Diagnosis: Acute pulmonary embolus [facility]) [[id number]]  Admitting Physician: Rodolph Bong [[id number]]  Attending Physician: Rodolph Bong [[id number]]  Certification:: I certify this patient will need inpatient services for at least 2 midnights  Expected Medical Readiness: 03/31/2023          B Medical/Surgery History Past Medical History:  Diagnosis Date   Coronary artery disease    Diabetes mellitus without complication (HCC)    Hyperlipemia    Multiple allergies    Past Surgical History:  Procedure Laterality Date   CORONARY ANGIOPLASTY WITH STENT PLACEMENT     KNEE SURGERY       A IV Location/Drains/Wounds Patient Lines/Drains/Airways Status     Active Line/Drains/Airways     Name Placement date Placement time Site Days   Peripheral IV 03/29/23 20 G Right Antecubital 03/29/23  1357  Antecubital  less than 1   Peripheral IV 03/29/23 20 G Left Antecubital 03/29/23  1752  Antecubital  less than 1            Intake/Output Last 24  hours  Intake/Output Summary (Last 24 hours) at 03/29/2023 1812 Last data filed at 03/29/2023 1517 Gross per 24 hour  Intake 1000 ml  Output --  Net 1000 ml    Labs/Imaging Results for orders placed or performed during the hospital encounter of 03/29/23 (from the past 48 hours)  Resp panel by RT-PCR (RSV, Flu A&B, Covid) Anterior Nasal Swab     Status: None   Collection Time: 03/29/23 12:17 PM   Specimen: Anterior Nasal Swab  Result Value Ref Range   SARS Coronavirus 2 by RT PCR NEGATIVE NEGATIVE    Comment: (NOTE) SARS-CoV-2 target nucleic acids are NOT DETECTED.  The SARS-CoV-2 RNA is generally detectable in upper respiratory specimens during the acute phase of infection. The lowest concentration of SARS-CoV-2 viral copies this assay can detect is 138 copies/mL. A negative result does not preclude SARS-Cov-2 infection and should not be used as the sole basis for treatment or other patient management decisions. A negative result may occur with  improper specimen collection/handling, submission of specimen other than nasopharyngeal swab, presence of viral mutation(s) within the areas targeted by this assay, and inadequate number of viral copies(<138 copies/mL). A negative result must be combined with clinical observations, patient history, and epidemiological information. The expected result is Negative.  Fact Sheet for Patients:  BloggerCourse.com  Fact Sheet for Healthcare Providers:  SeriousBroker.it  This test is no t yet approved or cleared  by the [place] and  has been authorized for detection and/or diagnosis of SARS-CoV-2 by FDA under an Emergency Use Authorization (EUA). This EUA will remain  in effect (meaning this test can be used) for the duration of the COVID-19 declaration under Section 564(b)(1) of the Act, 21 U.S.C.section 360bbb-3(b)(1), unless the authorization is terminated  or revoked sooner.        Influenza A by PCR NEGATIVE NEGATIVE   Influenza B by PCR NEGATIVE NEGATIVE    Comment: (NOTE) The Xpert Xpress SARS-CoV-2/FLU/RSV plus assay is intended as an aid in the diagnosis of influenza from Nasopharyngeal swab specimens and should not be used as a sole basis for treatment. Nasal washings and aspirates are unacceptable for Xpert Xpress SARS-CoV-2/FLU/RSV testing.  Fact Sheet for Patients: BloggerCourse.com  Fact Sheet for Healthcare Providers: SeriousBroker.it  This test is not yet approved or cleared by the [place] FDA and has been authorized for detection and/or diagnosis of SARS-CoV-2 by FDA under an Emergency Use Authorization (EUA). This EUA will remain in effect (meaning this test can be used) for the duration of the COVID-19 declaration under Section 564(b)(1) of the Act, 21 U.S.C. section 360bbb-3(b)(1), unless the authorization is terminated or revoked.     Resp Syncytial Virus by PCR NEGATIVE NEGATIVE    Comment: (NOTE) Fact Sheet for Patients: BloggerCourse.com  Fact Sheet for Healthcare Providers: SeriousBroker.it  This test is not yet approved or cleared by the [place] FDA and has been authorized for detection and/or diagnosis of SARS-CoV-2 by FDA under an Emergency Use Authorization (EUA). This EUA will remain in effect (meaning this test can be used) for the duration of the COVID-19 declaration under Section 564(b)(1) of the Act, 21 U.S.C. section 360bbb-3(b)(1), unless the authorization is terminated or revoked.  Performed at [facility], [address] [place], [place] [address]   POC CBG, ED     Status: Abnormal   Collection Time: 03/29/23 12:19 PM  Result Value Ref Range   Glucose-Capillary 224 (H) 70 - 99 mg/dL    Comment: Glucose reference range applies only to samples taken after fasting for at least 8 hours.  CBC  with Differential     Status: Abnormal   Collection Time: 03/29/23  1:50 PM  Result Value Ref Range   WBC 8.9 4.0 - 10.5 K/uL   RBC 4.28 4.22 - 5.81 MIL/uL   Hemoglobin 11.6 (L) 13.0 - 17.0 g/dL   HCT 21.3 (L) 08.6 - 57.8 %   MCV 84.6 80.0 - 100.0 fL   MCH 27.1 26.0 - 34.0 pg   MCHC 32.0 30.0 - 36.0 g/dL   RDW 46.9 62.9 - 52.8 %   Platelets 242 150 - 400 K/uL   nRBC 0.0 0.0 - 0.2 %   Neutrophils Relative % 72 %   Neutro Abs 6.5 1.7 - 7.7 K/uL   Lymphocytes Relative 17 %   Lymphs Abs 1.5 0.7 - 4.0 K/uL   Monocytes Relative 9 %   Monocytes Absolute 0.8 0.1 - 1.0 K/uL   Eosinophils Relative 1 %   Eosinophils Absolute 0.1 0.0 - 0.5 K/uL   Basophils Relative 1 %   Basophils Absolute 0.0 0.0 - 0.1 K/uL   Immature Granulocytes 0 %   Abs Immature Granulocytes 0.02 0.00 - 0.07 K/uL    Comment: Performed at [facility], [address] [place], [place] [address]  Basic metabolic panel     Status: Abnormal  Collection Time: 03/29/23  1:50 PM  Result Value Ref Range   Sodium 143 135 - 145 mmol/L   Potassium 4.1 3.5 - 5.1 mmol/L   Chloride 109 98 - 111 mmol/L   CO2 22 22 - 32 mmol/L   Glucose, Bld 190 (H) 70 - 99 mg/dL    Comment: Glucose reference range applies only to samples taken after fasting for at least 8 hours.   BUN 22 8 - 23 mg/dL   Creatinine, Ser 1.61 (H) 0.61 - 1.24 mg/dL   Calcium 09.6 8.9 - 04.5 mg/dL   GFR, Estimated 52 (L) >60 mL/min    Comment: (NOTE) Calculated using the CKD-EPI Creatinine Equation (2021)    Anion gap 12 5 - 15    Comment: Performed at [facility], [address] [place], [place] [address]  Troponin I (High Sensitivity)     Status: None   Collection Time: 03/29/23  1:50 PM  Result Value Ref Range   Troponin I (High Sensitivity) 4 <18 ng/L    Comment: (NOTE) Elevated high sensitivity troponin I (hsTnI) values and significant  changes across serial measurements may suggest ACS but many other  chronic and acute  conditions are known to elevate hsTnI results.  Refer to the "Links" section for chest pain algorithms and additional  guidance. Performed at [facility], [address] [place], [place] [address]   D-dimer, quantitative     Status: Abnormal   Collection Time: 03/29/23  1:50 PM  Result Value Ref Range   D-Dimer, Quant 0.74 (H) 0.00 - 0.50 ug/mL-FEU    Comment: (NOTE) At the manufacturer cut-off value of 0.5 g/mL FEU, this assay has a negative predictive value of 95-100%.This assay is intended for use in conjunction with a clinical pretest probability (PTP) assessment model to exclude pulmonary embolism (PE) and deep venous thrombosis (DVT) in outpatients suspected of PE or DVT. Results should be correlated with clinical presentation. Performed at [facility], [address] [place], [place] [address]   Troponin I (High Sensitivity)     Status: None   Collection Time: 03/29/23  3:30 PM  Result Value Ref Range   Troponin I (High Sensitivity) 4 <18 ng/L    Comment: (NOTE) Elevated high sensitivity troponin I (hsTnI) values and significant  changes across serial measurements may suggest ACS but many other  chronic and acute conditions are known to elevate hsTnI results.  Refer to the "Links" section for chest pain algorithms and additional  guidance. Performed at [facility], [address] [place], [place] [address]    CT Angio Chest PE W and/or Wo Contrast Addendum Date: 03/29/2023 ADDENDUM REPORT: 03/29/2023 17:09 ADDENDUM: Critical Value/emergent results were called by telephone at the time of interpretation on 03/29/2023 at 5:09 pm to provider ADAM CURATOLO , who verbally acknowledged these results. Electronically Signed   By: Genevive Bi M.D.   On: 03/29/2023 17:09   Result Date: 03/29/2023 CLINICAL DATA:  Short of breath. Concern for pulmonary embolism. Flu like symptoms. EXAM: CT ANGIOGRAPHY CHEST WITH CONTRAST TECHNIQUE: Multidetector  CT imaging of the chest was performed using the standard protocol during bolus administration of intravenous contrast. Multiplanar CT image reconstructions and MIPs were obtained to evaluate the vascular anatomy. RADIATION DOSE REDUCTION: This exam was performed according to the departmental dose-optimization program which includes automated exposure control, adjustment of the mA and/or kV according to patient size and/or use of iterative reconstruction  technique. CONTRAST:  80mL OMNIPAQUE IOHEXOL 350 MG/ML SOLN COMPARISON:  None Available. FINDINGS: Cardiovascular: There is a tubular filling defect within the RIGHT lower lobe pulmonary artery. Filling defect is in distal segmental branch and extends into the subsegmental branches of the RIGHT lower lobe (image 78 through 90 of series 302). No additional filling defects within the pulmonary arteries. No CT evidence of RIGHT ventricular strain with the RIGHT ventricular diameter to LEFT ventricular diameter ratio less than 1. Coronary artery calcification and aortic atherosclerotic calcification. Mediastinum/Nodes: No axillary or supraclavicular adenopathy. No mediastinal or hilar adenopathy. No pericardial fluid. Esophagus normal. Lungs/Pleura: No pulmonary infarction. No pneumonia. No pleural fluid. No pneumothorax Upper Abdomen: Limited view of the liver, kidneys, pancreas are unremarkable. Normal adrenal glands. Musculoskeletal: No aggressive osseous lesion. Review of the MIP images confirms the above findings. IMPRESSION: 1. Acute pulmonary embolism within the RIGHT lower lobe pulmonary artery. Overall clot burden is mild. 2. No evidence of RIGHT ventricular strain. 3. No pulmonary infarction Electronically Signed: By: Genevive Bi M.D. On: 03/29/2023 16:48   DG Chest Portable 1 View Result Date: 03/29/2023 CLINICAL DATA:  Weakness cough EXAM: PORTABLE CHEST 1 VIEW COMPARISON:  03/13/2023 FINDINGS: The heart size and mediastinal contours are within normal  limits. Both lungs are clear. The visualized skeletal structures are unremarkable. IMPRESSION: No active disease. Electronically Signed   By: Jasmine Pang M.D.   On: 03/29/2023 15:38    Pending Labs Unresulted Labs (From admission, onward)     Start     Ordered   03/31/23 0500  Heparin level (unfractionated)  Daily,   R      03/29/23 1724   03/30/23 0200  Heparin level (unfractionated)  Once-Timed,   URGENT        03/29/23 1724            Vitals/Pain Today's Vitals   03/29/23 1216 03/29/23 1358 03/29/23 1530 03/29/23 1742  BP: 120/79  132/81   Pulse: 81  70   Resp: 16  (!) 21   Temp: 98.3 F (36.8 C)   98.4 F (36.9 C)  TempSrc: Oral   Oral  SpO2: 99%  100%   Weight:      Height:      PainSc:  0-No pain      Isolation Precautions No active isolations  Medications Medications  heparin ADULT infusion 100 units/mL (25000 units/255mL) (1,900 Units/hr Intravenous New Bag/Given 03/29/23 1734)  sodium chloride 0.9 % bolus 1,000 mL (0 mLs Intravenous Stopped 03/29/23 1517)  iohexol (OMNIPAQUE) 350 MG/ML injection 100 mL (80 mLs Intravenous Contrast Given 03/29/23 1502)  heparin bolus via infusion 6,500 Units (6,500 Units Intravenous Bolus from Bag 03/29/23 1734)    Mobility Walks       Focused Assessments Pulmonary Assessment Handoff:       R Recommendations: See Admitting Provider Note  Report given to:   Additional Notes:

## 2023-03-29 NOTE — Progress Notes (Signed)
ANTICOAGULATION CONSULT NOTE - Initial Consult  Pharmacy Consult for Heparin Indication: pulmonary embolus  No Known Allergies  Patient Measurements: Height: 6\' 2"  (188 cm) Weight: 111 kg (244 lb 11.4 oz) IBW/kg (Calculated) : 82.2 Heparin Dosing Weight: 105.2 kg  Vital Signs: Temp: 98.3 F (36.8 C) (02/03 1216) Temp Source: Oral (02/03 1216) BP: 132/81 (02/03 1530) Pulse Rate: 70 (02/03 1530)  Labs: Recent Labs    03/29/23 1350 03/29/23 1530  HGB 11.6*  --   HCT 36.2*  --   PLT 242  --   CREATININE 1.48*  --   TROPONINIHS 4 4    Estimated Creatinine Clearance: 64.2 mL/min (A) (by C-G formula based on SCr of 1.48 mg/dL (H)).   Medical History: Past Medical History:  Diagnosis Date   Coronary artery disease    Diabetes mellitus without complication (HCC)    Hyperlipemia    Multiple allergies     Medications:  (Not in a hospital admission)  Scheduled:   omalizumab  300 mg Subcutaneous Q28 days   Infusions:  PRN:   Assessment: 67 yom with a history of DVT/PE no longer on anticoagulation, T2DM, HTN, CKD, BPH, HLD, RLS. Patient is presenting with ongoing generalized weakness for the last few weeks . Heparin per pharmacy consult placed for pulmonary embolus.  CTA PE w/ Acute pulmonary embolism within the RIGHT lower lobe pulmonary artery. Overall clot burden is mild. No evidence of RHS  Patient is not on anticoagulation prior to arrival.  Hgb 11.6; plt 242  Goal of Therapy:  Heparin level 0.3-0.7 units/ml Monitor platelets by anticoagulation protocol: Yes   Plan:  Give IV heparin 6500 units bolus x 1 Start heparin infusion at 1900 units/hr Check anti-Xa level in 8 hours and daily while on heparin Continue to monitor H&H and platelets  Delmar Landau, PharmD, BCPS 03/29/2023 5:20 PM ED Clinical Pharmacist -  [phone]

## 2023-03-29 NOTE — Care Plan (Signed)
Plan of Care Note for accepted transfer   Patient: Jeremiah Casey MRN: [id number]   DOA: 03/29/2023  Facility requesting transfer: [facility] Requesting Provider: Dr. Dalene Seltzer Reason for transfer: Acute PE Facility course:  Patient presented to the ED with a prior history of PE about 16 years ago provoked with after right knee surgery was on anticoagulation and completed course who presents to the ED with several weeks of generalized weakness, fatigue, decreased appetite, shortness of breath on exertion at times, decreased energy.  Patient denied any chest pain.  Patient with no strokelike symptoms.  Patient noted to have been evaluated for viral process a few weeks ago which was negative.  Patient seen in the ED workup including COVID-19 PCR noted to be negative, RSV by PCR negative, influenza A by PCR negative, influenza B by PCR negative.  Cardiac enzymes negative.  Basic metabolic profile with a creatinine of 1.48 otherwise within normal limits.  CBC with a hemoglobin of 11.6 otherwise within normal limits.  Vital signs stable.  Patient with sats of 100% on room air.  D-dimer elevated, CT angiogram chest obtained with an acute PE within the right lower lobe pulmonary artery.  Per ED physician patient with poor outpatient follow-up with PCP in [place] and patient currently trying to look for a new PCP.  Plan of care: The patient is accepted for admission to Telemetry unit, at [facility].  Patient to be started on IV heparin lower extremity Dopplers to be obtained per ED physician.  Author: Ramiro Harvest, MD 03/29/2023  Check [email or web address] for on-call coverage.  Nursing staff, Please call [facility] Admits & Consults System-Wide number on Amion as soon as patient's arrival, so appropriate admitting provider can evaluate the pt.

## 2023-03-29 NOTE — ED Provider Notes (Signed)
[place] [facility] Provider Note   CSN: [id number] Arrival date & time: 03/29/23  1157     History  Chief Complaint  Patient presents with   Weakness    Jeremiah Casey is a 68 y.o. male.  Patient here with ongoing generalized weakness for the last few weeks.  Had evaluation a couple weeks ago was negative for viral process.  He has not felt quite the same.  Decreased appetite.  Denies any headache.  Felt a little short of breath at times.  No chest pain.  Chest has not had much energy.  Denies any weakness numbness tingling.  No headaches.  No stroke symptoms.  Normal speech.  No fever chills abdominal pain nausea vomiting diarrhea.  History of blood clots but not on anticoagulation.  The history is provided by the patient.       Home Medications Prior to Admission medications   Medication Sig Start Date End Date Taking? Authorizing Provider  albuterol (VENTOLIN HFA) 108 (90 Base) MCG/ACT inhaler Inhale 2 puffs into the lungs every 4 (four) hours as needed for wheezing or shortness of breath. 04/25/18   [name], MD  aspirin 81 MG tablet Take 81 mg by mouth daily.    [provider]  atorvastatin (LIPITOR) 40 MG tablet Take 40 mg by mouth daily.    [provider]  benzonatate (TESSALON) 100 MG capsule Take 1 capsule (100 mg total) by mouth every 8 (eight) hours. 03/13/23   Small, Brooke L, PA  Canagliflozin-metFORMIN HCl (INVOKAMET) 150-500 MG TABS  03/15/17   [provider]  cetirizine (ZYRTEC) 10 MG tablet Take 1 tablet (10 mg total) by mouth 2 (two) times daily. 06/07/18   [name], DO  Cholecalciferol (VITAMIN D3) 1.25 MG (50000 UT) CAPS  02/18/18   [provider]  doxazosin (CARDURA) 2 MG tablet  04/04/14   [provider]  EPINEPHrine (AUVI-Q) 0.3 mg/0.3 mL IJ SOAJ injection Use as directed for severe allergic reactions 04/25/18   [name], MD  famotidine (PEPCID) 20 MG tablet Take 1  tablet (20 mg total) by mouth 2 (two) times daily. 06/07/18   [name], DO  fluticasone (FLONASE) 50 MCG/ACT nasal spray 1-2 sprays daily for nasal congestion. 06/07/18   [name], DO  glucose blood (ONE TOUCH ULTRA TEST) test strip  06/14/14   [provider]  ibuprofen (ADVIL,MOTRIN) 600 MG tablet Take 1 tablet (600 mg total) by mouth every 6 (six) hours as needed. 08/18/17   Horton, Mayer Masker, MD  metFORMIN (GLUCOPHAGE) 500 MG tablet Take by mouth 2 (two) times daily with a meal.    [provider]  metoprolol tartrate (LOPRESSOR) 25 MG tablet Take 25 mg by mouth 2 (two) times daily.    [provider]  montelukast (SINGULAIR) 10 MG tablet Take 1 tablet once a day to prevent coughing or wheezing 12/06/18   [name], DO  TRADJENTA 5 MG TABS tablet  04/11/18   [provider]      Allergies    Patient has no known allergies.    Review of Systems   Review of Systems  Physical Exam Updated Vital Signs BP 120/79 (BP Location: Left Arm)   Pulse 81   Temp 98.3 F (36.8 C) (Oral)   Resp 16   Ht 6\' 2"  (1.88 m)   Wt 111 kg   SpO2 99%   BMI 31.42 kg/m  Physical Exam Vitals and nursing note reviewed.  Constitutional:      General: He is not in acute distress.    Appearance: He is well-developed. He is not ill-appearing.  HENT:     Head: Normocephalic and atraumatic.     Nose: Nose normal.     Mouth/Throat:     Mouth: Mucous membranes are moist.  Eyes:     Extraocular Movements: Extraocular movements intact.     Conjunctiva/sclera: Conjunctivae normal.     Pupils: Pupils are equal, round, and reactive to light.  Cardiovascular:     Rate and Rhythm: Normal rate and regular rhythm.     Pulses: Normal pulses.     Heart sounds: Normal heart sounds. No murmur heard. Pulmonary:     Effort: Pulmonary effort is normal. No respiratory distress.     Breath sounds: Normal breath sounds.  Abdominal:     Palpations: Abdomen is soft.     Tenderness:  There is no abdominal tenderness.  Musculoskeletal:        General: No swelling.     Cervical back: Normal range of motion and neck supple.  Skin:    General: Skin is warm and dry.     Capillary Refill: Capillary refill takes less than 2 seconds.  Neurological:     General: No focal deficit present.     Mental Status: He is alert and oriented to person, place, and time.     Cranial Nerves: No cranial nerve deficit.     Sensory: No sensory deficit.     Motor: No weakness.     Coordination: Coordination normal.     Comments: 5+ out of 5 strength throughout, normal sensation, no drift, normal finger-to-nose finger, normal speech  Psychiatric:        Mood and Affect: Mood normal.     ED Results / Procedures / Treatments   Labs (all labs ordered are listed, but only abnormal results are displayed) Labs Reviewed  CBC WITH DIFFERENTIAL/PLATELET - Abnormal; Notable for the following components:      Result Value   Hemoglobin 11.6 (*)    HCT 36.2 (*)    All other components within normal limits  BASIC METABOLIC PANEL - Abnormal; Notable for the following components:   Glucose, Bld 190 (*)    Creatinine, Ser 1.48 (*)    GFR, Estimated 52 (*)    All other components within normal limits  D-DIMER, QUANTITATIVE - Abnormal; Notable for the following components:   D-Dimer, Quant 0.74 (*)    All other components within normal limits  CBG MONITORING, ED - Abnormal; Notable for the following components:   Glucose-Capillary 224 (*)    All other components within normal limits  RESP PANEL BY RT-PCR (RSV, FLU A&B, COVID)  [id number]  TROPONIN I (HIGH SENSITIVITY)    EKG EKG Interpretation Date/Time:  Monday March 29 2023 14:03:12 EST Ventricular Rate:  74 PR Interval:  189 QRS Duration:  104 QT Interval:  401 QTC Calculation: 445 R Axis:   -26  Text Interpretation: Sinus rhythm Borderline left axis deviation Confirmed by Virgina Norfolk [phone]) on 03/29/2023 2:04:54 PM  Radiology No  results found.  Procedures Procedures    Medications Ordered in ED Medications  sodium chloride 0.9 % bolus 1,000 mL (1,000 mLs Intravenous New Bag/Given 03/29/23 1358)    ED Course/ Medical Decision Making/ A&P  Medical Decision Making Amount and/or Complexity of Data Reviewed Labs: ordered. Radiology: ordered.   Jeremiah Casey is here with generalized weakness.  Has not felt well for the last couple weeks.  But he denies any active chest pain shortness of breath abdominal pain nausea vomit diarrhea.  No weakness numbness tingling.  No stroke symptoms.  He is neurologically intact.  He is overall well-appearing.  He continues with flulike symptoms.  He was seen a few weeks ago treated with steroids and cough medicine but still has not felt good energy.  Still has some decreased sense of taste and smell.  Overall we will do broad workup to evaluate for cardiac/pulmonary etiology including ACS PE pneumonia dehydration electrolyte abnormality.  Will check COVID flu RSV test and chest x-ray.  Will give fluid bolus and reevaluate.  Patient overall with positive D-dimer we will get a PE scan.  Lab work otherwise unremarkable.  No significant anemia electrolyte abnormality kidney injury or leukocytosis.  Troponin normal.  Overall patient to be handed off to oncoming ED staff with patient pending CT scan of the chest, repeat troponin.  Overall I suspect may be resolving viral process.  Please see oncoming ED provider's note for further results, evaluation, disposition of the patient.  This chart was dictated using voice recognition software.  Despite best efforts to proofread,  errors can occur which can change the documentation meaning.         Final Clinical Impression(s) / ED Diagnoses Final diagnoses:  Weakness    Rx / DC Orders ED Discharge Orders     None         Virgina Norfolk, DO 03/29/23 1438

## 2023-03-29 NOTE — ED Triage Notes (Signed)
Pt states continued flu like symptoms and weakness.  States no taste or smell at this time  Denies fever, denies any pain at this time    H/o DM

## 2023-03-30 ENCOUNTER — Other Ambulatory Visit (HOSPITAL_COMMUNITY): Payer: Self-pay

## 2023-03-30 ENCOUNTER — Telehealth (HOSPITAL_COMMUNITY): Payer: Self-pay | Admitting: Pharmacy Technician

## 2023-03-30 ENCOUNTER — Observation Stay (HOSPITAL_BASED_OUTPATIENT_CLINIC_OR_DEPARTMENT_OTHER): Payer: Medicare HMO

## 2023-03-30 DIAGNOSIS — J4520 Mild intermittent asthma, uncomplicated: Secondary | ICD-10-CM | POA: Diagnosis not present

## 2023-03-30 DIAGNOSIS — I2510 Atherosclerotic heart disease of native coronary artery without angina pectoris: Secondary | ICD-10-CM | POA: Diagnosis not present

## 2023-03-30 DIAGNOSIS — G4733 Obstructive sleep apnea (adult) (pediatric): Secondary | ICD-10-CM

## 2023-03-30 DIAGNOSIS — R7989 Other specified abnormal findings of blood chemistry: Secondary | ICD-10-CM | POA: Diagnosis not present

## 2023-03-30 DIAGNOSIS — N1831 Chronic kidney disease, stage 3a: Secondary | ICD-10-CM | POA: Diagnosis not present

## 2023-03-30 DIAGNOSIS — I2699 Other pulmonary embolism without acute cor pulmonale: Secondary | ICD-10-CM | POA: Diagnosis present

## 2023-03-30 DIAGNOSIS — E1122 Type 2 diabetes mellitus with diabetic chronic kidney disease: Secondary | ICD-10-CM | POA: Diagnosis not present

## 2023-03-30 LAB — HEMOGLOBIN A1C
Hgb A1c MFr Bld: 10.7 % — ABNORMAL HIGH (ref 4.8–5.6)
Mean Plasma Glucose: 260.39 mg/dL

## 2023-03-30 LAB — BASIC METABOLIC PANEL
Anion gap: 11 (ref 5–15)
BUN: 18 mg/dL (ref 8–23)
CO2: 22 mmol/L (ref 22–32)
Calcium: 9.2 mg/dL (ref 8.9–10.3)
Chloride: 108 mmol/L (ref 98–111)
Creatinine, Ser: 1.43 mg/dL — ABNORMAL HIGH (ref 0.61–1.24)
GFR, Estimated: 54 mL/min — ABNORMAL LOW (ref >60)
Glucose, Bld: 148 mg/dL — ABNORMAL HIGH (ref 70–99)
Potassium: 4.1 mmol/L (ref 3.5–5.1)
Sodium: 141 mmol/L (ref 135–145)

## 2023-03-30 LAB — CBC
HCT: 33.5 % — ABNORMAL LOW (ref 39.0–52.0)
Hemoglobin: 10.9 g/dL — ABNORMAL LOW (ref 13.0–17.0)
MCH: 27.7 pg (ref 26.0–34.0)
MCHC: 32.5 g/dL (ref 30.0–36.0)
MCV: 85.2 fL (ref 80.0–100.0)
Platelets: 216 10*3/uL (ref 150–400)
RBC: 3.93 MIL/uL — ABNORMAL LOW (ref 4.22–5.81)
RDW: 14.9 % (ref 11.5–15.5)
WBC: 8.4 10*3/uL (ref 4.0–10.5)
nRBC: 0 % (ref 0.0–0.2)

## 2023-03-30 LAB — GLUCOSE, CAPILLARY
Glucose-Capillary: 144 mg/dL — ABNORMAL HIGH (ref 70–99)
Glucose-Capillary: 245 mg/dL — ABNORMAL HIGH (ref 70–99)

## 2023-03-30 LAB — HIV ANTIBODY (ROUTINE TESTING W REFLEX): HIV Screen 4th Generation wRfx: NONREACTIVE

## 2023-03-30 LAB — HEPARIN LEVEL (UNFRACTIONATED): Heparin Unfractionated: >1.1 [IU]/mL — ABNORMAL HIGH (ref 0.30–0.70)

## 2023-03-30 MED ORDER — INSULIN PEN NEEDLE 32G X 4 MM MISC
0 refills | Status: DC
  Filled 2023-03-30: qty 100, 100d supply, fill #0

## 2023-03-30 MED ORDER — PEN NEEDLES 30G X 5 MM MISC: 1.0000 | Freq: Every day | 1 refills | Status: AC

## 2023-03-30 MED ORDER — INSULIN GLARGINE-YFGN 100 UNIT/ML ~~LOC~~ SOLN: 15.0000 [IU] | Freq: Every day | SUBCUTANEOUS | Status: DC

## 2023-03-30 MED ORDER — APIXABAN 5 MG PO TABS: 5.0000 mg | ORAL_TABLET | Freq: Two times a day (BID) | ORAL | 0 refills | Status: AC

## 2023-03-30 MED ORDER — INSULIN ASPART 100 UNIT/ML IJ SOLN: 0.0000 [IU] | Freq: Three times a day (TID) | INTRAMUSCULAR | Status: DC

## 2023-03-30 MED ORDER — INSULIN GLARGINE-YFGN 100 UNIT/ML ~~LOC~~ SOLN
10.0000 [IU] | Freq: Every day | SUBCUTANEOUS | Status: DC
  Administered 2023-03-30: 10 [IU] via SUBCUTANEOUS
  Filled 2023-03-30: qty 0.1

## 2023-03-30 MED ORDER — APIXABAN 5 MG PO TABS
10.0000 mg | ORAL_TABLET | Freq: Two times a day (BID) | ORAL | Status: DC
  Administered 2023-03-30: 10 mg via ORAL
  Filled 2023-03-30: qty 2

## 2023-03-30 MED ORDER — LANTUS SOLOSTAR 100 UNIT/ML ~~LOC~~ SOPN
10.0000 [IU] | PEN_INJECTOR | Freq: Every day | SUBCUTANEOUS | 0 refills | Status: AC
  Filled 2023-03-30: qty 3, 30d supply, fill #0

## 2023-03-30 MED ORDER — APIXABAN 5 MG PO TABS: 5.0000 mg | ORAL_TABLET | Freq: Two times a day (BID) | ORAL | Status: DC

## 2023-03-30 MED ORDER — INSULIN STARTER KIT- PEN NEEDLES (ENGLISH)
1.0000 | Freq: Once | Status: AC
  Administered 2023-03-30: 1
  Filled 2023-03-30: qty 1

## 2023-03-30 MED ORDER — HEPARIN (PORCINE) 25000 UT/250ML-% IV SOLN
1500.0000 [IU]/h | INTRAVENOUS | Status: DC
  Administered 2023-03-30: 1500 [IU]/h via INTRAVENOUS

## 2023-03-30 MED ORDER — APIXABAN (ELIQUIS) VTE STARTER PACK (10MG AND 5MG)
ORAL_TABLET | ORAL | 0 refills | Status: DC
  Filled 2023-03-30: qty 74, 30d supply, fill #0

## 2023-03-30 NOTE — Progress Notes (Signed)
 Discharge instructions reviewed with pt and his wife.  Copy of instructions given to pt. [facility] has filled pt's scripts for starter pack eliquis  and insulin  pen, these meds will be picked up on the way out for discharge. Pt also informed he has a refill of eliquis  for next month sent to his pharmacy he will need to pick up before he runs out of the starter pack, pt verbalizes understanding. Pt getting dressed at this time.  Pt will be d/c'd via wheelchair with belongings, with his wife and will be            escorted by staff.   Polly Barner,RN SWOT

## 2023-03-30 NOTE — Telephone Encounter (Signed)
 Pharmacy Patient Advocate Encounter  Received notification from [employer] that Prior Authorization for [facility] G7 SENSORS has been APPROVED from 02/24/2023 to 02/23/2024. Ran test claim, Copay is $64.60. This test claim was processed through [facility]- copay amounts may vary at other pharmacies due to pharmacy/plan contracts, or as the patient moves through the different stages of their insurance plan.   PA #/Case ID/Reference #: [id number]

## 2023-03-30 NOTE — Plan of Care (Signed)

## 2023-03-30 NOTE — Inpatient Diabetes Management (Addendum)
Inpatient Diabetes Program Recommendations  AACE/ADA: New Consensus Statement on Inpatient Glycemic Control (2015)  Target Ranges:  Prepandial:   less than 140 mg/dL      Peak postprandial:   less than 180 mg/dL (1-2 hours)      Critically ill patients:  140 - 180 mg/dL   Lab Results  Component Value Date   GLUCAP 245 (H) 03/30/2023   HGBA1C 10.7 (H) 03/30/2023    Review of Glycemic Control  Latest Reference Range & Units 03/30/23 06:16 03/30/23 11:15  Glucose-Capillary 70 - 99 mg/dL 161 (H) 096 (H)  (H): Data is abnormally high Diabetes history: Type 2 DM Outpatient Diabetes medications:Trijardy QD Current orders for Inpatient glycemic control: Novolog 0-6 units TID & HS  Inpatient Diabetes Program Recommendations:    Consider adding Semglee 10 units every day (to start now).  Spoke with patient and wife regarding outpatient diabetes management. Patient verifies home medications and denies missing doses. Reports that he has been feeling poorly and has been drinking more juice.  Reviewed patient's current A1c of 10.7%. Explained what a A1c is and what it measures. Also reviewed goal A1c with patient, importance of good glucose control @ home, and blood sugar goals. Reviewed patho of DM, need for improvement to glycemic control, survival skills, interventions, vascular changes and commorbidities.  Patient has not been checking outpatient with a glucose meter. Will need one at discharge. Also, interested in CGM.  Admits to drinking sugary beverages. Reviewed alternatives, importance of incorporating protein, and overall mindfulness of CHO intake. Dietitian consult placed. Outpatient education and [facility] referral placed.   Educated patient and spouse on insulin pen use at home. Reviewed contents of insulin flexpen starter kit. Reviewed all steps if insulin pen including attachment of needle, 2-unit air shot, dialing up dose, giving injection, removing needle, disposal of sharps, storage of  unused insulin, disposal of insulin etc. Patient able to provide successful return demonstration. Also reviewed troubleshooting with insulin pen. MD to give patient Rxs for insulin pens and insulin pen needles.  Awaiting pharmacy benefit check.   Addendum: Placed Freestyle libre 3 onto patient's right upper arm. Reviewed risks and benefits, application, and how to obtain additional sensors. Patient to follow up with PCP. Endocrinology list given. No additional questions at this time.  Thanks, Lujean Rave, MSN, RNC-OB Diabetes Coordinator [phone] (8a-5p)

## 2023-03-30 NOTE — Telephone Encounter (Signed)
 Pharmacy Patient Advocate Encounter   Received notification from  that prior authorization for Dexcom G7 Sensors is required/requested.   Insurance verification completed.   The patient is insured through [place] .   Per test claim: PA required; PA submitted to above mentioned insurance via CoverMyMeds Key/confirmation #/EOC [id number] Status is pending

## 2023-03-30 NOTE — Care Management Obs Status (Signed)
MEDICARE OBSERVATION STATUS NOTIFICATION   Patient Details  Name: Jeremiah Casey MRN: [id number] Date of Birth: 1956-02-14   Medicare Observation Status Notification Given:  Yes    Lorri Frederick, LCSW 03/30/2023, 12:53 PM

## 2023-03-30 NOTE — Telephone Encounter (Signed)
Pharmacy Patient Advocate Encounter  Insurance verification completed.    The patient is insured through [employer]. Patient has Medicare and is not eligible for a copay card, but may be able to apply for patient assistance or Medicare RX Payment Plan (Patient Must reach out to their plan, if eligible for payment plan), if available.    Ran test claim for Lantus and the current 30 day co-pay is $35.00.   Ran test claim for Humalog and the current 30 day co-pay is $35.00.  Ran test claim for Dexcom sensors and will require a prior authorization.  Ran test claim for [employer] sensors and will require a prior authorization.    This test claim was processed through [facility]- copay amounts may vary at other pharmacies due to pharmacy/plan contracts, or as the patient moves through the different stages of their insurance plan.

## 2023-03-30 NOTE — Care Management CC44 (Signed)
 Condition Code 44 Documentation Completed  Patient Details  Name: Jeremiah Casey MRN: [id number] Date of Birth: 1955-05-20   Condition Code 44 given:  Yes Patient signature on Condition Code 44 notice:  Yes Documentation of 2 MD's agreement:  Yes Code 44 added to claim:  Yes    Bridget Cordella Simmonds, LCSW 03/30/2023, 12:53 PM

## 2023-03-30 NOTE — Progress Notes (Signed)
 PHARMACY - ANTICOAGULATION CONSULT NOTE  Pharmacy Consult for heparin  Indication: pulmonary embolus  Labs: Recent Labs    03/29/23 1350 03/29/23 1530 03/30/23 0609  HGB 11.6*  --  10.9*  HCT 36.2*  --  33.5*  PLT 242  --  216  HEPARINUNFRC  --   --  >1.10*  CREATININE 1.48*  --  1.43*  TROPONINIHS 4 4  --    Assessment: 67yo male supratherapeutic on heparin  with initial dosing for PE; no infusion issues or signs of bleeding per RN.  Goal of Therapy:  Heparin  level 0.3-0.7 units/ml   Plan:  Hold heparin  infusion x1 hour then resume with decreased rate of 1500 units/hr. Check level in 6 hours.   Marvetta Dauphin, PharmD, BCPS 03/30/2023 7:09 AM

## 2023-03-30 NOTE — Discharge Instructions (Addendum)
Information on my medicine - ELIQUIS (apixaban)  This medication education was reviewed with me or my healthcare representative as part of my discharge preparation.    Why was Eliquis prescribed for you? Eliquis was prescribed to treat blood clots that may have been found in the veins of your legs (deep vein thrombosis) or in your lungs (pulmonary embolism) and to reduce the risk of them occurring again.  What do You need to know about Eliquis ? The starting dose is 10 mg (two 5 mg tablets) taken TWICE daily for the FIRST SEVEN (7) DAYS, then on (enter date)  04/06/2023  the dose is reduced to ONE 5 mg tablet taken TWICE daily.  Eliquis may be taken with or without food.   Try to take the dose about the same time in the morning and in the evening. If you have difficulty swallowing the tablet whole please discuss with your pharmacist how to take the medication safely.  Take Eliquis exactly as prescribed and DO NOT stop taking Eliquis without talking to the doctor who prescribed the medication.  Stopping may increase your risk of developing a new blood clot.  Refill your prescription before you run out.  After discharge, you should have regular check-up appointments with your healthcare provider that is prescribing your Eliquis.    What do you do if you miss a dose? If a dose of ELIQUIS is not taken at the scheduled time, take it as soon as possible on the same day and twice-daily administration should be resumed. The dose should not be doubled to make up for a missed dose.  Important Safety Information A possible side effect of Eliquis is bleeding. You should call your healthcare provider right away if you experience any of the following: Bleeding from an injury or your nose that does not stop. Unusual colored urine (red or dark brown) or unusual colored stools (red or black). Unusual bruising for unknown reasons. A serious fall or if you hit your head (even if there is no  bleeding).  Some medicines may interact with Eliquis and might increase your risk of bleeding or clotting while on Eliquis. To help avoid this, consult your healthcare provider or pharmacist prior to using any new prescription or non-prescription medications, including herbals, vitamins, non-steroidal anti-inflammatory drugs (NSAIDs) and supplements.  This website has more information on Eliquis (apixaban): http://www.eliquis.com/eliquis/home

## 2023-03-30 NOTE — Telephone Encounter (Signed)
Pharmacy Patient Advocate Encounter  Insurance verification completed.    The patient is insured through [place]. Patient has Medicare and is not eligible for a copay card, but may be able to apply for patient assistance or Medicare RX Payment Plan (Patient Must reach out to their plan, if eligible for payment plan), if available.    Ran test claim for Eliquis Starter Pack and the current 30 day co-pay is $297.00.  Copay applied to and met deductible. Copay $47.00 after deductible.   This test claim was processed through [facility]- copay amounts may vary at other pharmacies due to pharmacy/plan contracts, or as the patient moves through the different stages of their insurance plan.

## 2023-03-30 NOTE — Discharge Summary (Signed)
 Physician Discharge Summary  Jeremiah Casey FMW:[id number] DOB: January 01, 1956 DOA: 03/29/2023  PCP: O'Buch, Greta, PA-C  Admit date: 03/29/2023 Discharge date: 03/30/23  Admitted From: Home Disposition: Home Recommendations for Outpatient Follow-up:  Follow up with PCP in in 1 to 2 weeks or sooner if needed Check glycemic control, CBC and CMP at follow-up Please follow up on the following pending results: None  Home Health: Not indicated Equipment/Devices: Not indicated  Discharge Condition: Stable CODE STATUS: Full code  Follow-up Information     [facility] Follow up.   Why: Office will call with post hospital follow up appointment with Dr. CHARM Rav Contact information: [address], [place], [place] [address] Phone: [phone]        O'Buch, Greta, PA-C Follow up.   Specialty: Internal Medicine Contact information: [address] [place] [address] [phone]                 Hospital course 68 year old M with PMH of DM-2, CKD-3A, CAD s/p remote stent, asthma, OSA on CPAP, provoked PE after knee surgery about 15 years ago currently not on anticoagulation and recent viral illness presenting with generalized weakness, malaise and dyspnea on exertion, and admitted with acute pulmonary embolism within the right lower lobe pulmonary arteries without evidence of right heart strain.  Patient was sedentary for the last 2 to 3 weeks after he had viral illness.  Otherwise, no recent long car ride or flight or surgery.  Reports normal colonoscopy 2 years ago.  Denies smoking cigarettes.  In ED, stable vitals.  No oxygen requirement. Cr 1.48 (baseline).  COVID-19, influenza and RSV PCR nonreactive.  No leukocytosis.  D-dimer slightly elevated to 0.74.  Serial troponin and BNP negative.  CTA chest revealed acute PE within the right lower lobe pulmonary artery without evidence of right heart strain.  Patient was started on IV heparin .  Lower  extremity venous Doppler ordered.  He was admitted for further evaluation and care.  The next day, patient remained stable without respiratory distress.  Hemodynamically stable.  Lower extremity venous Doppler negative for DVT.  He was switched to starter pack Eliquis  and discharged home on Eliquis .  In regards to poorly controlled diabetes, A1c elevated to 10.7%.  Patient is on Invokamet, metformin and Tradjenta at home.  He is in the process of switching PCP as well.  We added basal insulin /Lantus  at 15 units daily.  He was counseled on management of diabetes and insulin  injection by diabetic coordinator.  He is already on a statin.  He has diabetic supplies.  See individual problem list below for more.   Problems addressed during this hospitalization Acute pulmonary embolism without cor pulmonale: Previous history of provoked PE after knee surgery 15 years ago.  Report he has been sedentary since his recent infection which might have contributed.  No other risk factors. LE venous doppler negative for DVT. -Treated with IV heparin  and discharged on starter pack Eliquis . -Outpatient follow-up with PCP  Poorly controlled NIDDM-2 with hyperglycemia and CKD-3A: Reports compliance with Invokamet, metformin and Tradjenta.  Admits to dietary indiscretion.  Reports drinking a lot of orange and eating sweets. -Counseled on lifestyle change including diet and exercise. -Continue home medications -Start basal insulin /Lantus  10 units daily -Continue home valsartan and statin.   History of CAD s/p remote stent.  No angina symptoms. -Will stop aspirin while on Eliquis  -Continue statin, beta-blocker and valsartan.  Mild intermittent asthma/OSA: Stable -Continue home  inhalers and CPAP   CKD-3A: Stable -Monitor outpatient  Obesity Body mass index is 31.42 kg/m. -Encourage lifestyle change to lose weight            Time spent 35 minutes  Vital signs Vitals:   03/29/23 1901 03/30/23 0022  03/30/23 0540 03/30/23 0738  BP: (!) 140/95 131/78 122/79 134/84  Pulse: 71 65 66 68  Temp: 98.3 F (36.8 C) 98.4 F (36.9 C) 98.3 F (36.8 C) 98.3 F (36.8 C)  Resp:  18 16 17   Height:      Weight:      SpO2: 100% 99% 96% 97%  TempSrc: Oral Oral Oral Oral  BMI (Calculated):         Discharge exam  GENERAL: No apparent distress.  Nontoxic. HEENT: MMM.  Vision and hearing grossly intact.  NECK: Supple.  No apparent JVD.  RESP:  No IWOB.  Fair aeration bilaterally. CVS:  RRR. Heart sounds normal.  ABD/GI/GU: BS+. Abd soft, NTND.  MSK/EXT:  Moves extremities. No apparent deformity. No edema.  SKIN: no apparent skin lesion or wound NEURO: Awake and alert. Oriented appropriately.  No apparent focal neuro deficit. PSYCH: Calm. Normal affect.   Discharge Instructions Discharge Instructions     AMB Referral VBCI Care Management   Complete by: As directed    A1c 10.7%, new to insulin ? Plan to place CGM   Expected date of contact: Emergent - 3 Days   Service: Pharmacy   Pharmacy Service For: Disease Management   Disease states to manage: Diabetes   Ambulatory referral to Nutrition and Diabetic Education   Complete by: As directed    Diet - low sodium heart healthy   Complete by: As directed    Diet Carb Modified   Complete by: As directed    Discharge instructions   Complete by: As directed    It has been a pleasure taking care of you!  You were hospitalized due to acute pulmonary embolism (blood clot in your right lung) for which you have been started on blood thinner (Eliquis ).  Please take this medication as prescribed.  We have stopped the aspirin.  We also recommend avoiding any over-the-counter pain medication other than plain Tylenol  while taking Eliquis .  In regards to your diabetes, your A1c is 10.7%.  The goal is to keep below 7.0%.  We have added insulin  in addition to your home medications.  It is very important that you watch your diet and stay active to help  with diabetes under good control.  Please review your new medication list and the directions on your medications before you take them.  Follow-up with your primary care doctor in 1 to 2 weeks or as soon as possible.   Take care,   Increase activity slowly   Complete by: As directed       Allergies as of 03/30/2023   No Known Allergies      Medication List     STOP taking these medications    aspirin 81 MG tablet   ibuprofen  600 MG tablet Commonly known as: ADVIL    metoprolol tartrate 25 MG tablet Commonly known as: LOPRESSOR       TAKE these medications    albuterol  108 (90 Base) MCG/ACT inhaler Commonly known as: Ventolin  HFA Inhale 2 puffs into the lungs every 4 (four) hours as needed for wheezing or shortness of breath.   Apixaban  Starter Pack (10mg  and 5mg ) Commonly known as: ELIQUIS  STARTER PACK Take as directed on  package: start with two-5mg  tablets twice daily for 7 days. On day 8, switch to one-5mg  tablet twice daily.   apixaban  5 MG Tabs tablet Commonly known as: ELIQUIS  Take 1 tablet (5 mg total) by mouth 2 (two) times daily. Start after you finish the starter pack Eliquis . Start taking on: April 29, 2023   atorvastatin  80 MG tablet Commonly known as: LIPITOR Take 80 mg by mouth daily. What changed: Another medication with the same name was removed. Continue taking this medication, and follow the directions you see here.   Basaglar  KwikPen 100 UNIT/ML Inject 10 Units into the skin daily. May substitute with Lantus  Solostar or Semglee  based on insurance preference   benzonatate  100 MG capsule Commonly known as: TESSALON  Take 1 capsule (100 mg total) by mouth every 8 (eight) hours.   cetirizine  10 MG tablet Commonly known as: ZYRTEC  Take 1 tablet (10 mg total) by mouth 2 (two) times daily.   doxazosin  2 MG tablet Commonly known as: CARDURA    EPINEPHrine  0.3 mg/0.3 mL Soaj injection Commonly known as: Auvi-Q  Use as directed for severe allergic  reactions   famotidine  20 MG tablet Commonly known as: PEPCID  Take 1 tablet (20 mg total) by mouth 2 (two) times daily.   fluticasone  50 MCG/ACT nasal spray Commonly known as: FLONASE  1-2 sprays daily for nasal congestion.   Invokamet 150-500 MG Tabs Generic drug: Canagliflozin-metFORMIN HCl   metFORMIN 500 MG tablet Commonly known as: GLUCOPHAGE Take by mouth 2 (two) times daily with a meal.   metoprolol succinate 25 MG 24 hr tablet Commonly known as: TOPROL-XL Take 25 mg by mouth daily.   montelukast  10 MG tablet Commonly known as: Singulair  Take 1 tablet once a day to prevent coughing or wheezing   ONE TOUCH ULTRA TEST test strip Generic drug: glucose blood   Pen Needles 30G X 5 MM Misc 1 Pen by Does not apply route daily.   rOPINIRole 1 MG tablet Commonly known as: REQUIP Take 1 mg by mouth 2 (two) times daily.   Tradjenta 5 MG Tabs tablet Generic drug: linagliptin   valsartan 80 MG tablet Commonly known as: DIOVAN Take 80 mg by mouth daily.   Vitamin D (Ergocalciferol) 1.25 MG (50000 UNIT) Caps capsule Commonly known as: DRISDOL Take 50,000 Units by mouth once a week.   Vitamin D3 1.25 MG (50000 UT) Caps        Consultations: None  Procedures/Studies:   VAS US  LOWER EXTREMITY VENOUS (DVT) Result Date: 03/30/2023  Lower Venous DVT Study Patient Name:  Jeremiah Casey  Date of Exam:   03/30/2023 Medical Rec #: [id number]   Accession #:    [id number] Date of Birth: [birth year]   Patient Gender: M Patient Age:   27 years Exam Location:  [facility] Procedure:      VAS US  LOWER EXTREMITY VENOUS (DVT) Referring Phys: MIGNON Madisen Ludvigsen --------------------------------------------------------------------------------  Indications: Pulmonary embolism, and Elevated D-dimer.  Risk Factors: Confirmed PE. Comparison Study: No prior exam. Performing Technologist: Edilia Elden Appl  Examination Guidelines: A complete evaluation includes B-mode imaging, spectral Doppler, color  Doppler, and power Doppler as needed of all accessible portions of each vessel. Bilateral testing is considered an integral part of a complete examination. Limited examinations for reoccurring indications may be performed as noted. The reflux portion of the exam is performed with the patient in reverse Trendelenburg.  +---------+---------------+---------+-----------+----------+--------------+ RIGHT    CompressibilityPhasicitySpontaneityPropertiesThrombus Aging +---------+---------------+---------+-----------+----------+--------------+ CFV      Full  Yes      Yes                                 +---------+---------------+---------+-----------+----------+--------------+ SFJ      Full           Yes      Yes                                 +---------+---------------+---------+-----------+----------+--------------+ FV Prox  Full                                                        +---------+---------------+---------+-----------+----------+--------------+ FV Mid   Full                                                        +---------+---------------+---------+-----------+----------+--------------+ FV DistalFull                                                        +---------+---------------+---------+-----------+----------+--------------+ PFV      Full                                                        +---------+---------------+---------+-----------+----------+--------------+ POP      Full           Yes      Yes                                 +---------+---------------+---------+-----------+----------+--------------+ PTV      Full                                                        +---------+---------------+---------+-----------+----------+--------------+ PERO     Full                                                        +---------+---------------+---------+-----------+----------+--------------+    +---------+---------------+---------+-----------+----------+--------------+ LEFT     CompressibilityPhasicitySpontaneityPropertiesThrombus Aging +---------+---------------+---------+-----------+----------+--------------+ CFV      Full           Yes      Yes                                 +---------+---------------+---------+-----------+----------+--------------+ SFJ      Full  Yes      Yes                                 +---------+---------------+---------+-----------+----------+--------------+ FV Prox  Full                                                        +---------+---------------+---------+-----------+----------+--------------+ FV Mid   Full                                                        +---------+---------------+---------+-----------+----------+--------------+ FV DistalFull                                                        +---------+---------------+---------+-----------+----------+--------------+ PFV      Full                                                        +---------+---------------+---------+-----------+----------+--------------+ POP      Full           Yes      Yes                                 +---------+---------------+---------+-----------+----------+--------------+ PTV      Full                                                        +---------+---------------+---------+-----------+----------+--------------+ PERO     Full                                                        +---------+---------------+---------+-----------+----------+--------------+    Summary: BILATERAL: - No evidence of deep vein thrombosis seen in the lower extremities, bilaterally. -No evidence of popliteal cyst, bilaterally.   *See table(s) above for measurements and observations.    Preliminary    CT Angio Chest PE W and/or Wo Contrast Addendum Date: 03/29/2023 ADDENDUM REPORT: 03/29/2023 17:09 ADDENDUM: Critical  Value/emergent results were called by telephone at the time of interpretation on 03/29/2023 at 5:09 pm to provider ADAM CURATOLO , who verbally acknowledged these results. Electronically Signed   By: Jackquline Boxer M.D.   On: 03/29/2023 17:09   Result Date: 03/29/2023 CLINICAL DATA:  Short of breath. Concern for pulmonary embolism. Flu like symptoms. EXAM: CT ANGIOGRAPHY CHEST WITH CONTRAST TECHNIQUE: Multidetector CT imaging of the chest was performed using the standard protocol during  bolus administration of intravenous contrast. Multiplanar CT image reconstructions and MIPs were obtained to evaluate the vascular anatomy. RADIATION DOSE REDUCTION: This exam was performed according to the departmental dose-optimization program which includes automated exposure control, adjustment of the mA and/or kV according to patient size and/or use of iterative reconstruction technique. CONTRAST:  80mL OMNIPAQUE  IOHEXOL  350 MG/ML SOLN COMPARISON:  None Available. FINDINGS: Cardiovascular: There is a tubular filling defect within the RIGHT lower lobe pulmonary artery. Filling defect is in distal segmental branch and extends into the subsegmental branches of the RIGHT lower lobe (image 78 through 90 of series 302). No additional filling defects within the pulmonary arteries. No CT evidence of RIGHT ventricular strain with the RIGHT ventricular diameter to LEFT ventricular diameter ratio less than 1. Coronary artery calcification and aortic atherosclerotic calcification. Mediastinum/Nodes: No axillary or supraclavicular adenopathy. No mediastinal or hilar adenopathy. No pericardial fluid. Esophagus normal. Lungs/Pleura: No pulmonary infarction. No pneumonia. No pleural fluid. No pneumothorax Upper Abdomen: Limited view of the liver, kidneys, pancreas are unremarkable. Normal adrenal glands. Musculoskeletal: No aggressive osseous lesion. Review of the MIP images confirms the above findings. IMPRESSION: 1. Acute pulmonary embolism  within the RIGHT lower lobe pulmonary artery. Overall clot burden is mild. 2. No evidence of RIGHT ventricular strain. 3. No pulmonary infarction Electronically Signed: By: Jackquline Boxer M.D. On: 03/29/2023 16:48   DG Chest Portable 1 View Result Date: 03/29/2023 CLINICAL DATA:  Weakness cough EXAM: PORTABLE CHEST 1 VIEW COMPARISON:  03/13/2023 FINDINGS: The heart size and mediastinal contours are within normal limits. Both lungs are clear. The visualized skeletal structures are unremarkable. IMPRESSION: No active disease. Electronically Signed   By: Luke Bun M.D.   On: 03/29/2023 15:38   DG Chest 2 View Result Date: 03/13/2023 CLINICAL DATA:  Dry cough for 2 weeks, intermittent headache and weakness, history of tobacco abuse EXAM: CHEST - 2 VIEW COMPARISON:  03/03/2016 FINDINGS: Frontal and lateral views of the chest demonstrate an unremarkable cardiac silhouette. No acute airspace disease, effusion, or pneumothorax. No acute bony abnormalities. IMPRESSION: 1. No acute intrathoracic process. Electronically Signed   By: Ozell Daring M.D.   On: 03/13/2023 18:10       The results of significant diagnostics from this hospitalization (including imaging, microbiology, ancillary and laboratory) are listed below for reference.     Microbiology: Recent Results (from the past 240 hours)  Resp panel by RT-PCR (RSV, Flu A&B, Covid) Anterior Nasal Swab     Status: None   Collection Time: 03/29/23 12:17 PM   Specimen: Anterior Nasal Swab  Result Value Ref Range Status   SARS Coronavirus 2 by RT PCR NEGATIVE NEGATIVE Final    Comment: (NOTE) SARS-CoV-2 target nucleic acids are NOT DETECTED.  The SARS-CoV-2 RNA is generally detectable in upper respiratory specimens during the acute phase of infection. The lowest concentration of SARS-CoV-2 viral copies this assay can detect is 138 copies/mL. A negative result does not preclude SARS-Cov-2 infection and should not be used as the sole basis for  treatment or other patient management decisions. A negative result may occur with  improper specimen collection/handling, submission of specimen other than nasopharyngeal swab, presence of viral mutation(s) within the areas targeted by this assay, and inadequate number of viral copies(<138 copies/mL). A negative result must be combined with clinical observations, patient history, and epidemiological information. The expected result is Negative.  Fact Sheet for Patients:  bloggercourse.com  Fact Sheet for Healthcare Providers:  seriousbroker.it  This test is no t  yet approved or cleared by the [place]  FDA and  has been authorized for detection and/or diagnosis of SARS-CoV-2 by FDA under an Emergency Use Authorization (EUA). This EUA will remain  in effect (meaning this test can be used) for the duration of the COVID-19 declaration under Section 564(b)(1) of the Act, 21 U.S.C.section 360bbb-3(b)(1), unless the authorization is terminated  or revoked sooner.       Influenza A by PCR NEGATIVE NEGATIVE Final   Influenza B by PCR NEGATIVE NEGATIVE Final    Comment: (NOTE) The Xpert Xpress SARS-CoV-2/FLU/RSV plus assay is intended as an aid in the diagnosis of influenza from Nasopharyngeal swab specimens and should not be used as a sole basis for treatment. Nasal washings and aspirates are unacceptable for Xpert Xpress SARS-CoV-2/FLU/RSV testing.  Fact Sheet for Patients: bloggercourse.com  Fact Sheet for Healthcare Providers: seriousbroker.it  This test is not yet approved or cleared by the [place]  FDA and has been authorized for detection and/or diagnosis of SARS-CoV-2 by FDA under an Emergency Use Authorization (EUA). This EUA will remain in effect (meaning this test can be used) for the duration of the COVID-19 declaration under Section 564(b)(1) of the Act, 21  U.S.C. section 360bbb-3(b)(1), unless the authorization is terminated or revoked.     Resp Syncytial Virus by PCR NEGATIVE NEGATIVE Final    Comment: (NOTE) Fact Sheet for Patients: bloggercourse.com  Fact Sheet for Healthcare Providers: seriousbroker.it  This test is not yet approved or cleared by the [place]  FDA and has been authorized for detection and/or diagnosis of SARS-CoV-2 by FDA under an Emergency Use Authorization (EUA). This EUA will remain in effect (meaning this test can be used) for the duration of the COVID-19 declaration under Section 564(b)(1) of the Act, 21 U.S.C. section 360bbb-3(b)(1), unless the authorization is terminated or revoked.  Performed at [facility], [address] [place], [place] [address]      Labs:  CBC: Recent Labs  Lab 03/29/23 1350 03/30/23 0609  WBC 8.9 8.4  NEUTROABS 6.5  --   HGB 11.6* 10.9*  HCT 36.2* 33.5*  MCV 84.6 85.2  PLT 242 216   BMP &GFR Recent Labs  Lab 03/29/23 1350 03/30/23 0609  NA 143 141  K 4.1 4.1  CL 109 108  CO2 22 22  GLUCOSE 190* 148*  BUN 22 18  CREATININE 1.48* 1.43*  CALCIUM  10.0 9.2   Estimated Creatinine Clearance: 66.4 mL/min (A) (by C-G formula based on SCr of 1.43 mg/dL (H)). Liver & Pancreas: No results for input(s): AST, ALT, ALKPHOS, BILITOT, PROT, ALBUMIN in the last 168 hours. No results for input(s): LIPASE, AMYLASE in the last 168 hours. No results for input(s): AMMONIA in the last 168 hours. Diabetic: Recent Labs    03/30/23 0609  HGBA1C 10.7*   Recent Labs  Lab 03/29/23 1219 03/30/23 0616 03/30/23 1115  GLUCAP 224* 144* 245*   Cardiac Enzymes: No results for input(s): CKTOTAL, CKMB, CKMBINDEX, TROPONINI in the last 168 hours. No results for input(s): PROBNP in the last 8760 hours. Coagulation Profile: No results for input(s): INR, PROTIME in the last 168  hours. Thyroid Function Tests: No results for input(s): TSH, T4TOTAL, FREET4, T3FREE, THYROIDAB in the last 72 hours. Lipid Profile: No results for input(s): CHOL, HDL, LDLCALC, TRIG, CHOLHDL, LDLDIRECT in the last 72 hours. Anemia Panel: No results for input(s): VITAMINB12, FOLATE, FERRITIN, TIBC, IRON, RETICCTPCT in the last 72 hours. Urine analysis: No results found for: COLORURINE, APPEARANCEUR,  LABSPEC, PHURINE, GLUCOSEU, HGBUR, BILIRUBINUR, KETONESUR, PROTEINUR, UROBILINOGEN, NITRITE, LEUKOCYTESUR Sepsis Labs: Invalid input(s): PROCALCITONIN, LACTICIDVEN   SIGNED:  Sharlisa Hollifield T Zakaiya Lares, MD  [facility] 03/30/2023, 3:07 PM

## 2023-04-06 DIAGNOSIS — L501 Idiopathic urticaria: Secondary | ICD-10-CM | POA: Diagnosis not present

## 2023-04-09 DIAGNOSIS — I2510 Atherosclerotic heart disease of native coronary artery without angina pectoris: Secondary | ICD-10-CM | POA: Diagnosis not present

## 2023-04-09 DIAGNOSIS — N1831 Chronic kidney disease, stage 3a: Secondary | ICD-10-CM | POA: Diagnosis not present

## 2023-04-09 DIAGNOSIS — G4733 Obstructive sleep apnea (adult) (pediatric): Secondary | ICD-10-CM | POA: Diagnosis not present

## 2023-04-09 DIAGNOSIS — I2693 Single subsegmental pulmonary embolism without acute cor pulmonale: Secondary | ICD-10-CM | POA: Diagnosis not present

## 2023-04-09 DIAGNOSIS — E118 Type 2 diabetes mellitus with unspecified complications: Secondary | ICD-10-CM | POA: Diagnosis not present

## 2023-04-09 DIAGNOSIS — G2581 Restless legs syndrome: Secondary | ICD-10-CM | POA: Diagnosis not present

## 2023-04-09 DIAGNOSIS — Z794 Long term (current) use of insulin: Secondary | ICD-10-CM | POA: Diagnosis not present

## 2023-04-09 DIAGNOSIS — J4520 Mild intermittent asthma, uncomplicated: Secondary | ICD-10-CM | POA: Diagnosis not present

## 2023-04-19 DIAGNOSIS — M7541 Impingement syndrome of right shoulder: Secondary | ICD-10-CM | POA: Diagnosis not present

## 2023-04-21 ENCOUNTER — Other Ambulatory Visit: Payer: Self-pay | Admitting: Pharmacist

## 2023-04-21 NOTE — Progress Notes (Signed)
 04/21/2023 Name: Jeremiah Casey MRN: [id number] DOB: 03/13/1955  Chief Complaint  Patient presents with   Medication Management   Diabetes    Jeremiah Casey is a 68 y.o. year old male who presented for a telephone visit.   They were referred to the pharmacist by  [facility] referral for [facility]  for assistance in managing diabetes.   ED on 03/13/23: Viral URI- no changes ED on 03/29/23- 03/30/23: Right-sided PE; Starter pack Eliquis + Lantus 10 U daily + Dexcom G7 New PCP visit on 04/09/23: Metformin increased to twice a day + Lantus decreased to 8 U daily [facility] 04/19/23: MRI for impingement syndrome  Subjective:  Care Team: Primary Care Provider: [name], DO ; Next Scheduled Visit: 07/26/23 Clinical Pharmacist: [name], PharmD  Medication Access/Adherence  Current Pharmacy:  [id number] - [place], [place] - [address] AT [facility] [address] [place] [address] Phone: [phone] Fax: [phone]   Patient reports affordability concerns with their medications: No  Patient reports access/transportation concerns to their pharmacy: No  Patient reports adherence concerns with their medications:  No     Diabetes:  Current medications: Metformin 500mg  twice a day, Lantus 8 U daily, Rybelsus 3 mg (7 mg picked up for next month) Medications tried in the past: Invokamet, Tradjenta, Trijardy XR  Using Libe 3 plus sensors and phone app currently- was $30 at the pharmacy for 1 month supply   Patient denies hypoglycemic s/sx including dizziness, shakiness, sweating. Patient denies hyperglycemic symptoms including polyuria, polydipsia, polyphagia, nocturia, neuropathy, blurred vision.  Current meal patterns: Unknown  Current physical activity: Unknown  Current medication access support: [employer]age   Objective:  Lab Results  Component Value Date   HGBA1C 10.7 (H) 03/30/2023    Lab Results  Component Value Date    CREATININE 1.43 (H) 03/30/2023   BUN 18 03/30/2023   NA 141 03/30/2023   K 4.1 03/30/2023   CL 108 03/30/2023   CO2 22 03/30/2023    No results found for: "CHOL", "HDL", "LDLCALC", "LDLDIRECT", "TRIG", "CHOLHDL"  Medications Reviewed Today     Reviewed by Pollie Friar, RPH (Pharmacist) on 04/21/23 at 1623  Med List Status: <None>   Medication Order Taking? Sig Documenting Provider Last Dose Status Informant  albuterol (VENTOLIN HFA) 108 (90 Base) MCG/ACT inhaler [id number] Yes Inhale 2 puffs into the lungs every 4 (four) hours as needed for wheezing or shortness of breath. [name], MD Taking Active   apixaban (ELIQUIS) 5 MG TABS tablet [id number] Yes Take 1 tablet (5 mg total) by mouth 2 (two) times daily. Start after you finish the starter pack Eliquis. [name], MD Taking Active   APIXABAN Everlene Balls) VTE STARTER PACK (10MG  AND 5MG ) [id number] Yes Take as directed on package: start with two-5mg  tablets twice daily for 7 days. On day 8, switch to one-5mg  tablet twice daily. [name], MD Taking Active   atorvastatin (LIPITOR) 80 MG tablet [id number] Yes Take 80 mg by mouth daily. [provider] Taking Active   Canagliflozin-metFORMIN HCl (INVOKAMET) 150-500 MG TABS [id number] No   Patient not taking: Reported on 04/21/2023   [provider] Not Taking Active   cetirizine (ZYRTEC) 10 MG tablet [id number] Yes Take 1 tablet (10 mg total) by mouth 2 (two) times daily. [name], DO Taking Active   Continuous Glucose Sensor (FREESTYLE LIBRE 3 SENSOR) [place] [id number]  as directed for 30 days [provider]  Active   doxazosin (CARDURA) 2 MG tablet [id number] Yes  [provider] Taking Active   EPINEPHrine (AUVI-Q) 0.3 mg/0.3 mL IJ SOAJ injection [id number] Yes Use as directed for severe allergic reactions Bardelas, Bonnita Hollow, MD Taking Active   famotidine (PEPCID) 20 MG tablet [id number] Yes Take 1 tablet (20 mg total) by mouth 2 (two) times daily.  [name], DO Taking Active   fluticasone [facility]) 50 MCG/ACT nasal spray [id number] Yes 1-2 sprays daily for nasal congestion. [name], DO Taking Active   glucose blood (ONE TOUCH ULTRA TEST) test strip [id number] Yes  [provider] Taking Active            Med Note [name], [name]   Wed Apr 21, 2023  4:22 PM) True Metrix  insulin glargine (LANTUS SOLOSTAR) 100 UNIT/ML Solostar Pen [id number] Yes Inject 10 Units into the skin daily. [name], MD Taking Active            Med Note [name], [name]   Wed Apr 21, 2023  4:18 PM) 8 units daily  Insulin Pen Needle (PEN NEEDLES) 30G X 5 MM MISC [id number] Yes 1 Pen by Does not apply route daily. [name], MD Taking Active   metFORMIN (GLUCOPHAGE) 500 MG tablet [id number] Yes Take by mouth 2 (two) times daily with a meal. [provider] Taking Active Self  metoprolol succinate (TOPROL-XL) 25 MG 24 hr tablet [id number] No Take 25 mg by mouth daily.  Patient not taking: Reported on 04/21/2023   [provider] Not Taking Active   montelukast (SINGULAIR) 10 MG tablet [id number] Yes Take 1 tablet once a day to prevent coughing or wheezing [name], DO Taking Active   omalizumab [name]) injection 300 mg [id number]   [name], DO  Active   rOPINIRole (REQUIP) 1 MG tablet [id number] Yes Take 1 mg by mouth 2 (two) times daily. [provider] Taking Active   RYBELSUS 3 MG TABS [id number] Yes Take 1 tablet by mouth daily. [provider]  Active   RYBELSUS 7 MG TABS [id number] Yes Take 1 tablet by mouth daily. [provider]  Active   valsartan (DIOVAN) 80 MG tablet [id number] Yes Take 80 mg by mouth daily. [provider] Taking Active   Vitamin D, Ergocalciferol, (DRISDOL) 1.25 MG (50000 UNIT) CAPS capsule [id number] Yes Take 50,000 Units by mouth once a week. [provider] Taking Active               Assessment/Plan:   Diabetes: - Currently uncontrolled -  Reviewed long term cardiovascular and renal outcomes of uncontrolled blood sugar - Reviewed goal A1c, goal fasting, and goal 2 hour post prandial glucose - Patient denies personal or family history of multiple endocrine neoplasia type 2, medullary thyroid cancer; personal history of pancreatitis or gallbladder disease. - Recommend to check glucose continuously with Josephine Igo 3 plus     Follow Up Plan:  - Follow-up call on 05/17/23 to add Libreview and review BG readings more in-depth - Reviewed BG goals with medications and reports no cost issues at this time - Advised Eliquis should be $47 each month going forward and advised to reach out to office if any issues going forward   [name], PharmD [facility] Phone Number: [phone]

## 2023-04-30 DIAGNOSIS — M7581 Other shoulder lesions, right shoulder: Secondary | ICD-10-CM | POA: Diagnosis not present

## 2023-04-30 DIAGNOSIS — M7541 Impingement syndrome of right shoulder: Secondary | ICD-10-CM | POA: Diagnosis not present

## 2023-04-30 DIAGNOSIS — M25511 Pain in right shoulder: Secondary | ICD-10-CM | POA: Diagnosis not present

## 2023-04-30 DIAGNOSIS — M129 Arthropathy, unspecified: Secondary | ICD-10-CM | POA: Diagnosis not present

## 2023-05-03 ENCOUNTER — Encounter: Payer: Self-pay | Admitting: Dietician

## 2023-05-03 ENCOUNTER — Encounter: Payer: Medicare HMO | Attending: Student | Admitting: Dietician

## 2023-05-03 VITALS — Ht 74.0 in | Wt 242.8 lb

## 2023-05-03 DIAGNOSIS — E1122 Type 2 diabetes mellitus with diabetic chronic kidney disease: Secondary | ICD-10-CM | POA: Diagnosis not present

## 2023-05-03 DIAGNOSIS — N1831 Chronic kidney disease, stage 3a: Secondary | ICD-10-CM | POA: Diagnosis not present

## 2023-05-03 NOTE — Progress Notes (Signed)
 Diabetes Self-Management Education  Visit Type: First/Initial  Appt. Start Time: 1415 Appt. End Time: 1530  05/03/2023  Mr. Jeremiah Casey, identified by name and date of birth, is a 68 y.o. male with a diagnosis of Diabetes: Type 2.   ASSESSMENT  Height 6\' 2"  (1.88 m), weight 242 lb 12.8 oz (110.1 kg). Body mass index is 31.17 kg/m.\  Pt wife, Dedra Skeens, present for appointment Pt reports being hospitalized for URI and subsequent pulmonary embolism, pt reports severe hyperglycemia in ED (>400 mg/dL). Pt reports some weakness in Pt reports making significant dietary changes since being hospitalized (yogurt and fruit, no fried foods, eating more lean meats, eating more veggies, less sweets), pt reports significant decrease in reflux since making these changes Pt reports taking Metformin, Rybelsus, and Lantus for their DM, no hypoglycemia reported. Pt reports using Freestyle Libre 3 CGM, finds it to be very helpful. CGM Results from download:   Average glucose:   124 mg/dL for 35 days  Glucose management indicator:   6.3 %  Time in range (70-180 mg/dL):   97 %   (Goal >21%)  Time High (181-250 mg/dL):   3 %   (Goal < 30%)  Time Very High (>250 mg/dL):    0 %   (Goal < 5%)  Time Low (54-69 mg/dL):   0 %   (Goal <8%)  Time Very Low (<54 mg/dL):   0 %   (Goal <6%)     Diabetes Self-Management Education - 05/03/23 1439       Visit Information   Visit Type First/Initial      Initial Visit   Diabetes Type Type 2    Date Diagnosed 2023    Are you currently following a meal plan? No    Are you taking your medications as prescribed? Yes      Health Coping   How would you rate your overall health? Fair      Psychosocial Assessment   Patient Belief/Attitude about Diabetes Motivated to manage diabetes    Self-care barriers None    Self-management support Doctor's office;Church;Family    Other persons present Patient;Spouse/SO    Patient Concerns Nutrition/Meal planning;Glycemic  Control;Healthy Lifestyle    Special Needs None    Preferred Learning Style Hands on;Auditory    Learning Readiness Change in progress    How often do you need to have someone help you when you read instructions, pamphlets, or other written materials from your doctor or pharmacy? 1 - Never    What is the last grade level you completed in school? 12th grade      Pre-Education Assessment   Patient understands the diabetes disease and treatment process. Needs Instruction    Patient understands incorporating nutritional management into lifestyle. Needs Instruction    Patient undertands incorporating physical activity into lifestyle. Needs Instruction    Patient understands using medications safely. Needs Instruction    Patient understands monitoring blood glucose, interpreting and using results Needs Instruction    Patient understands prevention, detection, and treatment of acute complications. Needs Instruction    Patient understands prevention, detection, and treatment of chronic complications. Needs Instruction    Patient understands how to develop strategies to address psychosocial issues. Needs Instruction    Patient understands how to develop strategies to promote health/change behavior. Needs Instruction      Complications   Last HgB A1C per patient/outside source 10.7 %   03/30/2023   How often do you check your blood sugar? > 4 times/day  Fasting Blood glucose range (mg/dL) 72-536;644-034    Postprandial Blood glucose range (mg/dL) 742-595    Number of hypoglycemic episodes per month 0    Number of hyperglycemic episodes ( >200mg /dL): Never    Have you had a dilated eye exam in the past 12 months? Yes    Have you had a dental exam in the past 12 months? Yes      Dietary Intake   Breakfast 2 [place] sausages, 2 eggs, coffee    Snack (morning) small apple juice, PB nabs    Lunch Cheese stick, PB sandwich on honey wheat, grapes    Dinner [employer] salad, fried rice, beef and  broccoli, honey chicken and noodles    Beverage(s) Water, apple juice, coffee      Activity / Exercise   Activity / Exercise Type ADL's;Light (walking / raking leaves)    How many days per week do you exercise? 3    How many minutes per day do you exercise? 30    Total minutes per week of exercise 90      Patient Education   Previous Diabetes Education No    Disease Pathophysiology Explored patient's options for treatment of their diabetes;Factors that contribute to the development of diabetes    Healthy Eating Plate Method;Role of diet in the treatment of diabetes and the relationship between the three main macronutrients and blood glucose level;Food label reading, portion sizes and measuring food.    Being Active Role of exercise on diabetes management, blood pressure control and cardiac health.;Identified with patient nutritional and/or medication changes necessary with exercise.;Helped patient identify appropriate exercises in relation to his/her diabetes, diabetes complications and other health issue.    Medications Taught/reviewed insulin/injectables, injection, site rotation, insulin/injectables storage and needle disposal.;Reviewed patients medication for diabetes, action, purpose, timing of dose and side effects.    Monitoring Taught/evaluated CGM (comment);Identified appropriate SMBG and/or A1C goals.    Chronic complications Relationship between chronic complications and blood glucose control    Diabetes Stress and Support Worked with patient to identify barriers to care and solutions;Role of stress on diabetes      Individualized Goals (developed by patient)   Nutrition Follow meal plan discussed;General guidelines for healthy choices and portions discussed    Physical Activity Exercise 3-5 times per week    Medications take my medication as prescribed    Monitoring  Consistenly use CGM      Post-Education Assessment   Patient understands the diabetes disease and treatment  process. Comprehends key points    Patient understands incorporating nutritional management into lifestyle. Comprehends key points    Patient undertands incorporating physical activity into lifestyle. Comprehends key points    Patient understands using medications safely. Comphrehends key points    Patient understands monitoring blood glucose, interpreting and using results Comprehends key points    Patient understands prevention, detection, and treatment of acute complications. Comprehends key points    Patient understands prevention, detection, and treatment of chronic complications. Comprehends key points    Patient understands how to develop strategies to address psychosocial issues. Comprehends key points    Patient understands how to develop strategies to promote health/change behavior. Comprehends key points      Outcomes   Expected Outcomes Demonstrated interest in learning. Expect positive outcomes    Future DMSE PRN    Program Status Not Completed             Individualized Plan for Diabetes Self-Management Training:   Learning Objective:  Patient will have a greater understanding of diabetes self-management. Patient education plan is to attend individual and/or group sessions per assessed needs and concerns.   Plan:   Patient Instructions  Desired Ranges on GCM Report: Very Low (below 54 mg/dL): <1% Low (below 70 mg/dL): <6% Time in Range (70 - 180 mg/dL): >10% High (960 - 454 mg/dL): <09% Very High (above 250 mg/dL): <8%  Check your blood sugar each morning before eating or drinking (fasting). Look for numbers between 70-130 mg/dL Check your blood sugar 2 hours after you begin eating a meal. Look for numbers under 180 mg/dL at all times.  Your goal A1c is below 7.0%  Continue eating three meals a day, about 5-6 hours apart!  Begin to recognize carbohydrates, proteins, and non-starchy vegetables in your food choices!  Begin to build your meals using the  proportions of the Balanced Plate. First, select your carb choice(s) for the meal. Make this 25% of your meal. Next, select your source of protein to pair with your carb choice(s). Make this another 25% of your meal. Finally, complete your meal with a variety of non-starchy vegetables. Make this the remaining 50% of your meal.    Expected Outcomes:  Demonstrated interest in learning. Expect positive outcomes  Education material provided: ADA - How to Thrive: A Guide for Your Journey with Diabetes and My Plate  If problems or questions, patient to contact team via:  Phone and Email  Future DSME appointment: PRN

## 2023-05-03 NOTE — Patient Instructions (Addendum)
 Desired Ranges on GCM Report: Very Low (below 54 mg/dL): <5% Low (below 70 mg/dL): <7% Time in Range (70 - 180 mg/dL): >84% High (696 - 295 mg/dL): <28% Very High (above 250 mg/dL): <4%  Check your blood sugar each morning before eating or drinking (fasting). Look for numbers between 70-130 mg/dL Check your blood sugar 2 hours after you begin eating a meal. Look for numbers under 180 mg/dL at all times.  Your goal A1c is below 7.0%  Continue eating three meals a day, about 5-6 hours apart!  Begin to recognize carbohydrates, proteins, and non-starchy vegetables in your food choices!  Begin to build your meals using the proportions of the Balanced Plate. First, select your carb choice(s) for the meal. Make this 25% of your meal. Next, select your source of protein to pair with your carb choice(s). Make this another 25% of your meal. Finally, complete your meal with a variety of non-starchy vegetables. Make this the remaining 50% of your meal.

## 2023-05-04 DIAGNOSIS — M6281 Muscle weakness (generalized): Secondary | ICD-10-CM | POA: Diagnosis not present

## 2023-05-04 DIAGNOSIS — R262 Difficulty in walking, not elsewhere classified: Secondary | ICD-10-CM | POA: Diagnosis not present

## 2023-05-04 DIAGNOSIS — R94131 Abnormal electromyogram [EMG]: Secondary | ICD-10-CM | POA: Diagnosis not present

## 2023-05-04 DIAGNOSIS — M5412 Radiculopathy, cervical region: Secondary | ICD-10-CM | POA: Diagnosis not present

## 2023-05-04 DIAGNOSIS — L501 Idiopathic urticaria: Secondary | ICD-10-CM | POA: Diagnosis not present

## 2023-05-04 DIAGNOSIS — G5611 Other lesions of median nerve, right upper limb: Secondary | ICD-10-CM | POA: Diagnosis not present

## 2023-05-06 DIAGNOSIS — R262 Difficulty in walking, not elsewhere classified: Secondary | ICD-10-CM | POA: Diagnosis not present

## 2023-05-06 DIAGNOSIS — M6281 Muscle weakness (generalized): Secondary | ICD-10-CM | POA: Diagnosis not present

## 2023-05-11 DIAGNOSIS — R262 Difficulty in walking, not elsewhere classified: Secondary | ICD-10-CM | POA: Diagnosis not present

## 2023-05-11 DIAGNOSIS — M6281 Muscle weakness (generalized): Secondary | ICD-10-CM | POA: Diagnosis not present

## 2023-05-14 DIAGNOSIS — E1122 Type 2 diabetes mellitus with diabetic chronic kidney disease: Secondary | ICD-10-CM | POA: Diagnosis not present

## 2023-05-14 DIAGNOSIS — I2699 Other pulmonary embolism without acute cor pulmonale: Secondary | ICD-10-CM | POA: Diagnosis not present

## 2023-05-14 DIAGNOSIS — R29898 Other symptoms and signs involving the musculoskeletal system: Secondary | ICD-10-CM | POA: Diagnosis not present

## 2023-05-14 DIAGNOSIS — N1831 Chronic kidney disease, stage 3a: Secondary | ICD-10-CM | POA: Diagnosis not present

## 2023-05-14 DIAGNOSIS — G5691 Unspecified mononeuropathy of right upper limb: Secondary | ICD-10-CM | POA: Diagnosis not present

## 2023-05-17 ENCOUNTER — Other Ambulatory Visit: Payer: Self-pay | Admitting: Pharmacist

## 2023-05-17 DIAGNOSIS — M5412 Radiculopathy, cervical region: Secondary | ICD-10-CM | POA: Diagnosis not present

## 2023-05-17 NOTE — Progress Notes (Signed)
 05/17/2023 Name: Jeremiah Casey MRN: [id number] DOB: May 22, 1955  Chief Complaint  Patient presents with   Medication Management    Jeremiah Casey is a 68 y.o. year old male who presented for a telephone visit.   They were referred to the pharmacist [facility]  [facility] Jerem[facility]  for assistance in managing diabetes.   ED on 03/13/23: Viral URI- no changes ED on 03/29/23- 03/30/23: Right-sided PE; Starter pack Eliquis + Lantus 10 U daily + Dexcom G7 New PCP visit on 04/09/23: Metformin increased to twice a day + Lantus decreased to 8 U daily [facility] 04/19/23: MRI for impingement syndrome  Subjective:  Care Team: Primary Care Provider: [name], DO ; Next Scheduled Visit: 07/26/23 Clinical Pharmacist: [name], PharmD  Medication Access/Adherence  Current Pharmacy:  [id number] - [place] - [address] AT [facility] OF [address] [place] [address] Phone: [phone] Fax: [phone]   Patient reports affordability concerns with their medications: No  Patient reports access/transportation concerns to their pharmacy: No  Patient reports adherence concerns with their medications:  No     Diabetes:  Current medications: Metformin 500mg  twice a day, Rybelsus 7 mg daily Medications tried in the past: Invokamet, Tradjenta, Trijardy XR, Lantus (stopped once increase Rybelsus to 7mg  daily)  Using Libe 3 plus sensors and phone app currently- was $30 at the pharmacy for 1 month supply   Patient denies hypoglycemic s/sx including dizziness, shakiness, sweating. Patient denies hyperglycemic symptoms including polyuria, polydipsia, polyphagia, nocturia, neuropathy, blurred vision.  Current meal patterns: Unknown  Current physical activity: Unknown  Current medication access support: [employer]   Objective:  Lab Results  Component Value Date   HGBA1C 10.7 (H) 03/30/2023    Lab Results  Component Value Date    CREATININE 1.43 (H) 03/30/2023   BUN 18 03/30/2023   NA 141 03/30/2023   K 4.1 03/30/2023   CL 108 03/30/2023   CO2 22 03/30/2023    No results found for: "CHOL", "HDL", "LDLCALC", "LDLDIRECT", "TRIG", "CHOLHDL"  Medications Reviewed Today     Reviewed by Linna Darner, [place] Casey (Pharmacist) on 05/17/23 at 1022  Med List Status: <None>   Medication Order Taking? Sig Documenting Provider Last Dose Status Informant  albuterol (VENTOLIN HFA) 108 (90 Base) MCG/ACT inhaler [id number] Yes Inhale 2 puffs into the lungs every 4 (four) hours as needed for wheezing or shortness of breath. [name], MD Taking Active   apixaban (ELIQUIS) 5 MG TABS tablet [id number] No Take 1 tablet (5 mg total) by mouth 2 (two) times daily. Start after you finish the starter pack Eliquis.  Patient not taking: Reported on 05/17/2023   Jeremiah Hercules, MD Not Taking Active   atorvastatin (LIPITOR) 80 MG tablet [id number] Yes Take 80 mg by mouth daily. [provider] Taking Active   cetirizine (ZYRTEC) 10 MG tablet [id number] Yes Take 1 tablet (10 mg total) by mouth 2 (two) times daily. Jeremiah Sia, DO Taking Active   Continuous Glucose Sensor (FREESTYLE LIBRE 3 [place]) [place] [id number] Yes as directed for 30 days [provider] Taking Active   doxazosin (CARDURA) 2 MG tablet [id number] Yes  [provider] Taking Active   EPINEPHrine (AUVI-Q) 0.3 mg/0.3 mL IJ SOAJ injection [id number] Yes Use as directed for severe allergic reactions Bardelas, Bonnita Hollow, MD Taking Active   famotidine (PEPCID) 20 MG tablet [id number] No Take 1  tablet (20 mg total) by mouth 2 (two) times daily.  Patient not taking: Reported on 05/17/2023   Jeremiah Sia, DO Not Taking Active   fluticasone [facility]) 50 MCG/ACT nasal spray [id number] Yes 1-2 sprays daily for nasal congestion. Jeremiah Sia, DO Taking Active   glucose blood (ONE TOUCH ULTRA TEST) test strip [id number] Yes  [provider] Taking Active            Med  Note Jeremiah Casey, Jeremiah Casey M   Wed Apr 21, 2023  4:22 PM) True Metrix  insulin glargine (LANTUS SOLOSTAR) 100 UNIT/ML Solostar Pen [id number] No Inject 10 Units into the skin daily.  Patient not taking: Reported on 05/17/2023   Jeremiah Hercules, MD Not Taking Active            Med Note Jeremiah Casey, [name] M   Wed Apr 21, 2023  4:18 PM) 8 units daily  Insulin Pen Needle (PEN NEEDLES) 30G X 5 MM MISC [id number] Yes 1 Pen by Does not apply route daily. Jeremiah Hercules, MD Taking Active   metFORMIN (GLUCOPHAGE) 500 MG tablet [id number] Yes Take by mouth 2 (two) times daily with a meal. [provider] Taking Active Self  montelukast (SINGULAIR) 10 MG tablet [id number] Yes Take 1 tablet once a day to prevent coughing or wheezing Jeremiah Sia, DO Taking Active   omalizumab Jeremiah Casey) injection 300 mg [id number]   Jeremiah Sia, DO  Active   rOPINIRole (REQUIP) 1 MG tablet [id number] Yes Take 1 mg by mouth 2 (two) times daily. [provider] Taking Active   RYBELSUS 7 MG TABS [id number] Yes Take 1 tablet by mouth daily. [provider] Taking Active   TRUEplus Lancets 33G MISC [id number] Yes SMARTSIG:Lancet Topical [provider] Taking Active   valsartan (DIOVAN) 80 MG tablet [id number] Yes Take 80 mg by mouth daily. [provider] Taking Active   Vitamin D, Ergocalciferol, (DRISDOL) 1.25 MG (50000 UNIT) CAPS capsule [id number] Yes Take 50,000 Units by mouth once a week. [provider] Taking Active   XARELTO 20 MG TABS tablet [id number] Yes Take 20 mg by mouth daily. [provider] Taking Active               Assessment/Plan:   Diabetes: - Currently uncontrolled - Reviewed long term cardiovascular and renal outcomes of uncontrolled blood sugar - Reviewed goal A1c, goal fasting, and goal 2 hour post prandial glucose - Patient denies personal or family history of multiple endocrine neoplasia type 2, medullary thyroid cancer; personal history of pancreatitis or  gallbladder disease. - Recommend to check glucose continuously with Libre 3 plus- $30 each month    Follow Up Plan:  - No follow-up needed at this time - BG appears to be well controlled still - Patient has a drug deductible of $250, which means his first fill of Eliquis or Xarelto will be about $297; it will then be $47 each month going forward and advised to reach out to office if any issues going forward  - Does not appear to have been picked up from the pharmacy yet- has been using office samples  - Eliquis 03/30/23 dispense was completed with free trial pack card to get started- no money paid  [name], PharmD [facility] Phone Number: [phone]

## 2023-05-31 DIAGNOSIS — M542 Cervicalgia: Secondary | ICD-10-CM | POA: Diagnosis not present

## 2023-05-31 DIAGNOSIS — M545 Low back pain, unspecified: Secondary | ICD-10-CM | POA: Diagnosis not present

## 2023-05-31 DIAGNOSIS — M549 Dorsalgia, unspecified: Secondary | ICD-10-CM | POA: Diagnosis not present

## 2023-06-08 DIAGNOSIS — L501 Idiopathic urticaria: Secondary | ICD-10-CM | POA: Diagnosis not present

## 2023-06-09 DIAGNOSIS — M549 Dorsalgia, unspecified: Secondary | ICD-10-CM | POA: Diagnosis not present

## 2023-06-09 DIAGNOSIS — M4712 Other spondylosis with myelopathy, cervical region: Secondary | ICD-10-CM | POA: Diagnosis not present

## 2023-06-09 DIAGNOSIS — M51379 Other intervertebral disc degeneration, lumbosacral region without mention of lumbar back pain or lower extremity pain: Secondary | ICD-10-CM | POA: Diagnosis not present

## 2023-06-09 DIAGNOSIS — M51369 Other intervertebral disc degeneration, lumbar region without mention of lumbar back pain or lower extremity pain: Secondary | ICD-10-CM | POA: Diagnosis not present

## 2023-06-09 DIAGNOSIS — M5134 Other intervertebral disc degeneration, thoracic region: Secondary | ICD-10-CM | POA: Diagnosis not present

## 2023-06-09 DIAGNOSIS — M5124 Other intervertebral disc displacement, thoracic region: Secondary | ICD-10-CM | POA: Diagnosis not present

## 2023-06-09 DIAGNOSIS — M4714 Other spondylosis with myelopathy, thoracic region: Secondary | ICD-10-CM | POA: Diagnosis not present

## 2023-06-09 DIAGNOSIS — M4802 Spinal stenosis, cervical region: Secondary | ICD-10-CM | POA: Diagnosis not present

## 2023-06-09 DIAGNOSIS — M542 Cervicalgia: Secondary | ICD-10-CM | POA: Diagnosis not present

## 2023-06-09 DIAGNOSIS — M48061 Spinal stenosis, lumbar region without neurogenic claudication: Secondary | ICD-10-CM | POA: Diagnosis not present

## 2023-06-09 DIAGNOSIS — M5126 Other intervertebral disc displacement, lumbar region: Secondary | ICD-10-CM | POA: Diagnosis not present

## 2023-06-09 DIAGNOSIS — M545 Low back pain, unspecified: Secondary | ICD-10-CM | POA: Diagnosis not present

## 2023-06-09 DIAGNOSIS — M5001 Cervical disc disorder with myelopathy,  high cervical region: Secondary | ICD-10-CM | POA: Diagnosis not present

## 2023-06-09 DIAGNOSIS — D321 Benign neoplasm of spinal meninges: Secondary | ICD-10-CM | POA: Diagnosis not present

## 2023-06-09 DIAGNOSIS — M4804 Spinal stenosis, thoracic region: Secondary | ICD-10-CM | POA: Diagnosis not present

## 2023-06-28 DIAGNOSIS — M4802 Spinal stenosis, cervical region: Secondary | ICD-10-CM | POA: Diagnosis not present

## 2023-06-28 DIAGNOSIS — D492 Neoplasm of unspecified behavior of bone, soft tissue, and skin: Secondary | ICD-10-CM | POA: Diagnosis not present

## 2023-06-28 DIAGNOSIS — Z86718 Personal history of other venous thrombosis and embolism: Secondary | ICD-10-CM | POA: Diagnosis not present

## 2023-07-02 DIAGNOSIS — D492 Neoplasm of unspecified behavior of bone, soft tissue, and skin: Secondary | ICD-10-CM | POA: Diagnosis not present

## 2023-07-02 DIAGNOSIS — I2510 Atherosclerotic heart disease of native coronary artery without angina pectoris: Secondary | ICD-10-CM | POA: Diagnosis not present

## 2023-07-02 DIAGNOSIS — E119 Type 2 diabetes mellitus without complications: Secondary | ICD-10-CM | POA: Diagnosis not present

## 2023-07-02 DIAGNOSIS — N189 Chronic kidney disease, unspecified: Secondary | ICD-10-CM | POA: Diagnosis not present

## 2023-07-02 DIAGNOSIS — Z01818 Encounter for other preprocedural examination: Secondary | ICD-10-CM | POA: Diagnosis not present

## 2023-07-02 DIAGNOSIS — R9431 Abnormal electrocardiogram [ECG] [EKG]: Secondary | ICD-10-CM | POA: Diagnosis not present

## 2023-07-02 DIAGNOSIS — G4733 Obstructive sleep apnea (adult) (pediatric): Secondary | ICD-10-CM | POA: Diagnosis not present

## 2023-07-06 DIAGNOSIS — E785 Hyperlipidemia, unspecified: Secondary | ICD-10-CM | POA: Diagnosis not present

## 2023-07-06 DIAGNOSIS — I2510 Atherosclerotic heart disease of native coronary artery without angina pectoris: Secondary | ICD-10-CM | POA: Diagnosis not present

## 2023-07-06 DIAGNOSIS — Z86718 Personal history of other venous thrombosis and embolism: Secondary | ICD-10-CM | POA: Diagnosis not present

## 2023-07-06 DIAGNOSIS — I10 Essential (primary) hypertension: Secondary | ICD-10-CM | POA: Diagnosis not present

## 2023-07-06 DIAGNOSIS — E119 Type 2 diabetes mellitus without complications: Secondary | ICD-10-CM | POA: Diagnosis not present

## 2023-07-06 DIAGNOSIS — Z86711 Personal history of pulmonary embolism: Secondary | ICD-10-CM | POA: Diagnosis not present

## 2023-07-06 DIAGNOSIS — G4733 Obstructive sleep apnea (adult) (pediatric): Secondary | ICD-10-CM | POA: Diagnosis not present

## 2023-07-09 DIAGNOSIS — I2510 Atherosclerotic heart disease of native coronary artery without angina pectoris: Secondary | ICD-10-CM | POA: Diagnosis not present

## 2023-07-09 DIAGNOSIS — Z5181 Encounter for therapeutic drug level monitoring: Secondary | ICD-10-CM | POA: Diagnosis not present

## 2023-07-09 DIAGNOSIS — N1831 Chronic kidney disease, stage 3a: Secondary | ICD-10-CM | POA: Diagnosis not present

## 2023-07-09 DIAGNOSIS — I10 Essential (primary) hypertension: Secondary | ICD-10-CM | POA: Diagnosis not present

## 2023-07-09 DIAGNOSIS — Z7901 Long term (current) use of anticoagulants: Secondary | ICD-10-CM | POA: Diagnosis not present

## 2023-07-09 DIAGNOSIS — M47812 Spondylosis without myelopathy or radiculopathy, cervical region: Secondary | ICD-10-CM | POA: Diagnosis not present

## 2023-07-09 DIAGNOSIS — E118 Type 2 diabetes mellitus with unspecified complications: Secondary | ICD-10-CM | POA: Diagnosis not present

## 2023-07-09 DIAGNOSIS — G4733 Obstructive sleep apnea (adult) (pediatric): Secondary | ICD-10-CM | POA: Diagnosis not present

## 2023-07-09 DIAGNOSIS — D492 Neoplasm of unspecified behavior of bone, soft tissue, and skin: Secondary | ICD-10-CM | POA: Diagnosis not present

## 2023-07-09 DIAGNOSIS — D497 Neoplasm of unspecified behavior of endocrine glands and other parts of nervous system: Secondary | ICD-10-CM | POA: Diagnosis not present

## 2023-07-09 DIAGNOSIS — Z86711 Personal history of pulmonary embolism: Secondary | ICD-10-CM | POA: Diagnosis not present

## 2023-07-09 DIAGNOSIS — M4802 Spinal stenosis, cervical region: Secondary | ICD-10-CM | POA: Diagnosis not present

## 2023-07-09 DIAGNOSIS — D329 Benign neoplasm of meninges, unspecified: Secondary | ICD-10-CM | POA: Diagnosis not present

## 2023-07-13 DIAGNOSIS — L501 Idiopathic urticaria: Secondary | ICD-10-CM | POA: Diagnosis not present

## 2023-07-16 DIAGNOSIS — E1122 Type 2 diabetes mellitus with diabetic chronic kidney disease: Secondary | ICD-10-CM | POA: Diagnosis not present

## 2023-07-16 DIAGNOSIS — G9762 Postprocedural hematoma of a nervous system organ or structure following other procedure: Secondary | ICD-10-CM | POA: Diagnosis not present

## 2023-07-16 DIAGNOSIS — D334 Benign neoplasm of spinal cord: Secondary | ICD-10-CM | POA: Diagnosis not present

## 2023-07-16 DIAGNOSIS — D492 Neoplasm of unspecified behavior of bone, soft tissue, and skin: Secondary | ICD-10-CM | POA: Diagnosis not present

## 2023-07-16 DIAGNOSIS — I69354 Hemiplegia and hemiparesis following cerebral infarction affecting left non-dominant side: Secondary | ICD-10-CM | POA: Diagnosis not present

## 2023-07-16 DIAGNOSIS — I214 Non-ST elevation (NSTEMI) myocardial infarction: Secondary | ICD-10-CM | POA: Diagnosis not present

## 2023-07-16 DIAGNOSIS — M5000 Cervical disc disorder with myelopathy, unspecified cervical region: Secondary | ICD-10-CM | POA: Diagnosis not present

## 2023-07-16 DIAGNOSIS — N183 Chronic kidney disease, stage 3 unspecified: Secondary | ICD-10-CM | POA: Diagnosis not present

## 2023-07-16 DIAGNOSIS — I129 Hypertensive chronic kidney disease with stage 1 through stage 4 chronic kidney disease, or unspecified chronic kidney disease: Secondary | ICD-10-CM | POA: Diagnosis not present

## 2023-07-16 DIAGNOSIS — G8194 Hemiplegia, unspecified affecting left nondominant side: Secondary | ICD-10-CM | POA: Diagnosis not present

## 2023-07-16 DIAGNOSIS — R29818 Other symptoms and signs involving the nervous system: Secondary | ICD-10-CM | POA: Diagnosis not present

## 2023-07-16 DIAGNOSIS — G9519 Other vascular myelopathies: Secondary | ICD-10-CM | POA: Diagnosis not present

## 2023-07-16 DIAGNOSIS — M50021 Cervical disc disorder at C4-C5 level with myelopathy: Secondary | ICD-10-CM | POA: Diagnosis not present

## 2023-07-16 DIAGNOSIS — G9761 Postprocedural hematoma of a nervous system organ or structure following a nervous system procedure: Secondary | ICD-10-CM | POA: Diagnosis not present

## 2023-07-16 DIAGNOSIS — R009 Unspecified abnormalities of heart beat: Secondary | ICD-10-CM | POA: Diagnosis not present

## 2023-07-16 DIAGNOSIS — D321 Benign neoplasm of spinal meninges: Secondary | ICD-10-CM | POA: Diagnosis not present

## 2023-07-16 DIAGNOSIS — G992 Myelopathy in diseases classified elsewhere: Secondary | ICD-10-CM | POA: Diagnosis not present

## 2023-07-16 DIAGNOSIS — R9431 Abnormal electrocardiogram [ECG] [EKG]: Secondary | ICD-10-CM | POA: Diagnosis not present

## 2023-07-16 DIAGNOSIS — D337 Benign neoplasm of other specified parts of central nervous system: Secondary | ICD-10-CM | POA: Diagnosis not present

## 2023-07-16 DIAGNOSIS — M4802 Spinal stenosis, cervical region: Secondary | ICD-10-CM | POA: Diagnosis not present

## 2023-07-16 DIAGNOSIS — Z9889 Other specified postprocedural states: Secondary | ICD-10-CM | POA: Diagnosis not present

## 2023-07-16 DIAGNOSIS — I472 Ventricular tachycardia, unspecified: Secondary | ICD-10-CM | POA: Diagnosis not present

## 2023-07-16 DIAGNOSIS — I491 Atrial premature depolarization: Secondary | ICD-10-CM | POA: Diagnosis not present

## 2023-07-16 DIAGNOSIS — I444 Left anterior fascicular block: Secondary | ICD-10-CM | POA: Diagnosis not present

## 2023-07-16 DIAGNOSIS — I493 Ventricular premature depolarization: Secondary | ICD-10-CM | POA: Diagnosis not present

## 2023-07-16 DIAGNOSIS — R29898 Other symptoms and signs involving the musculoskeletal system: Secondary | ICD-10-CM | POA: Diagnosis not present

## 2023-07-16 DIAGNOSIS — I5020 Unspecified systolic (congestive) heart failure: Secondary | ICD-10-CM | POA: Diagnosis not present

## 2023-07-16 DIAGNOSIS — I159 Secondary hypertension, unspecified: Secondary | ICD-10-CM | POA: Diagnosis not present

## 2023-07-16 DIAGNOSIS — S14154A Other incomplete lesion at C4 level of cervical spinal cord, initial encounter: Secondary | ICD-10-CM | POA: Diagnosis not present

## 2023-07-16 DIAGNOSIS — D6832 Hemorrhagic disorder due to extrinsic circulating anticoagulants: Secondary | ICD-10-CM | POA: Diagnosis not present

## 2023-07-16 DIAGNOSIS — I219 Acute myocardial infarction, unspecified: Secondary | ICD-10-CM | POA: Diagnosis not present

## 2023-07-18 DIAGNOSIS — R29898 Other symptoms and signs involving the musculoskeletal system: Secondary | ICD-10-CM | POA: Diagnosis not present

## 2023-07-18 DIAGNOSIS — M50021 Cervical disc disorder at C4-C5 level with myelopathy: Secondary | ICD-10-CM | POA: Diagnosis not present

## 2023-07-22 DIAGNOSIS — R55 Syncope and collapse: Secondary | ICD-10-CM | POA: Diagnosis not present

## 2023-07-22 DIAGNOSIS — I213 ST elevation (STEMI) myocardial infarction of unspecified site: Secondary | ICD-10-CM | POA: Diagnosis not present

## 2023-07-22 DIAGNOSIS — Z7409 Other reduced mobility: Secondary | ICD-10-CM | POA: Diagnosis not present

## 2023-07-22 DIAGNOSIS — Z483 Aftercare following surgery for neoplasm: Secondary | ICD-10-CM | POA: Diagnosis not present

## 2023-07-22 DIAGNOSIS — I214 Non-ST elevation (NSTEMI) myocardial infarction: Secondary | ICD-10-CM | POA: Diagnosis not present

## 2023-07-22 DIAGNOSIS — R9431 Abnormal electrocardiogram [ECG] [EKG]: Secondary | ICD-10-CM | POA: Diagnosis not present

## 2023-07-22 DIAGNOSIS — I959 Hypotension, unspecified: Secondary | ICD-10-CM | POA: Diagnosis not present

## 2023-07-22 DIAGNOSIS — E1122 Type 2 diabetes mellitus with diabetic chronic kidney disease: Secondary | ICD-10-CM | POA: Diagnosis not present

## 2023-07-22 DIAGNOSIS — G959 Disease of spinal cord, unspecified: Secondary | ICD-10-CM | POA: Diagnosis not present

## 2023-07-22 DIAGNOSIS — M47812 Spondylosis without myelopathy or radiculopathy, cervical region: Secondary | ICD-10-CM | POA: Diagnosis not present

## 2023-07-22 DIAGNOSIS — M50021 Cervical disc disorder at C4-C5 level with myelopathy: Secondary | ICD-10-CM | POA: Diagnosis not present

## 2023-07-22 DIAGNOSIS — R0602 Shortness of breath: Secondary | ICD-10-CM | POA: Diagnosis not present

## 2023-07-22 DIAGNOSIS — Z87891 Personal history of nicotine dependence: Secondary | ICD-10-CM | POA: Diagnosis not present

## 2023-07-22 DIAGNOSIS — I252 Old myocardial infarction: Secondary | ICD-10-CM | POA: Diagnosis not present

## 2023-07-22 DIAGNOSIS — Z5189 Encounter for other specified aftercare: Secondary | ICD-10-CM | POA: Diagnosis not present

## 2023-07-22 DIAGNOSIS — I2510 Atherosclerotic heart disease of native coronary artery without angina pectoris: Secondary | ICD-10-CM | POA: Diagnosis not present

## 2023-07-22 DIAGNOSIS — E785 Hyperlipidemia, unspecified: Secondary | ICD-10-CM | POA: Diagnosis not present

## 2023-07-22 DIAGNOSIS — I77810 Thoracic aortic ectasia: Secondary | ICD-10-CM | POA: Diagnosis not present

## 2023-07-22 DIAGNOSIS — I2694 Multiple subsegmental pulmonary emboli without acute cor pulmonale: Secondary | ICD-10-CM | POA: Diagnosis not present

## 2023-07-22 DIAGNOSIS — I82412 Acute embolism and thrombosis of left femoral vein: Secondary | ICD-10-CM | POA: Diagnosis not present

## 2023-07-22 DIAGNOSIS — N183 Chronic kidney disease, stage 3 unspecified: Secondary | ICD-10-CM | POA: Diagnosis not present

## 2023-07-22 DIAGNOSIS — I10 Essential (primary) hypertension: Secondary | ICD-10-CM | POA: Diagnosis not present

## 2023-07-22 DIAGNOSIS — E119 Type 2 diabetes mellitus without complications: Secondary | ICD-10-CM | POA: Diagnosis not present

## 2023-07-22 DIAGNOSIS — M50321 Other cervical disc degeneration at C4-C5 level: Secondary | ICD-10-CM | POA: Diagnosis not present

## 2023-07-22 DIAGNOSIS — G2581 Restless legs syndrome: Secondary | ICD-10-CM | POA: Diagnosis not present

## 2023-07-22 DIAGNOSIS — J9601 Acute respiratory failure with hypoxia: Secondary | ICD-10-CM | POA: Diagnosis not present

## 2023-07-22 DIAGNOSIS — G9589 Other specified diseases of spinal cord: Secondary | ICD-10-CM | POA: Diagnosis not present

## 2023-07-22 DIAGNOSIS — I440 Atrioventricular block, first degree: Secondary | ICD-10-CM | POA: Diagnosis not present

## 2023-07-22 DIAGNOSIS — D329 Benign neoplasm of meninges, unspecified: Secondary | ICD-10-CM | POA: Diagnosis not present

## 2023-07-22 DIAGNOSIS — I2699 Other pulmonary embolism without acute cor pulmonale: Secondary | ICD-10-CM | POA: Diagnosis not present

## 2023-07-22 DIAGNOSIS — I4510 Unspecified right bundle-branch block: Secondary | ICD-10-CM | POA: Diagnosis not present

## 2023-07-22 DIAGNOSIS — I129 Hypertensive chronic kidney disease with stage 1 through stage 4 chronic kidney disease, or unspecified chronic kidney disease: Secondary | ICD-10-CM | POA: Diagnosis not present

## 2023-07-23 DIAGNOSIS — E1122 Type 2 diabetes mellitus with diabetic chronic kidney disease: Secondary | ICD-10-CM | POA: Diagnosis not present

## 2023-07-23 DIAGNOSIS — N183 Chronic kidney disease, stage 3 unspecified: Secondary | ICD-10-CM | POA: Diagnosis not present

## 2023-07-23 DIAGNOSIS — G2581 Restless legs syndrome: Secondary | ICD-10-CM | POA: Diagnosis not present

## 2023-07-23 DIAGNOSIS — D329 Benign neoplasm of meninges, unspecified: Secondary | ICD-10-CM | POA: Diagnosis not present

## 2023-07-23 DIAGNOSIS — I2699 Other pulmonary embolism without acute cor pulmonale: Secondary | ICD-10-CM | POA: Diagnosis not present

## 2023-07-23 DIAGNOSIS — I493 Ventricular premature depolarization: Secondary | ICD-10-CM | POA: Diagnosis not present

## 2023-07-23 DIAGNOSIS — I129 Hypertensive chronic kidney disease with stage 1 through stage 4 chronic kidney disease, or unspecified chronic kidney disease: Secondary | ICD-10-CM | POA: Diagnosis not present

## 2023-07-23 DIAGNOSIS — R9431 Abnormal electrocardiogram [ECG] [EKG]: Secondary | ICD-10-CM | POA: Diagnosis not present

## 2023-07-23 DIAGNOSIS — I213 ST elevation (STEMI) myocardial infarction of unspecified site: Secondary | ICD-10-CM | POA: Diagnosis not present

## 2023-07-23 DIAGNOSIS — I2694 Multiple subsegmental pulmonary emboli without acute cor pulmonale: Secondary | ICD-10-CM | POA: Diagnosis not present

## 2023-07-23 DIAGNOSIS — I82412 Acute embolism and thrombosis of left femoral vein: Secondary | ICD-10-CM | POA: Diagnosis not present

## 2023-07-23 DIAGNOSIS — J9601 Acute respiratory failure with hypoxia: Secondary | ICD-10-CM | POA: Diagnosis not present

## 2023-07-24 DIAGNOSIS — J9601 Acute respiratory failure with hypoxia: Secondary | ICD-10-CM | POA: Diagnosis not present

## 2023-07-25 DIAGNOSIS — I2694 Multiple subsegmental pulmonary emboli without acute cor pulmonale: Secondary | ICD-10-CM | POA: Diagnosis not present

## 2023-07-26 DIAGNOSIS — I2694 Multiple subsegmental pulmonary emboli without acute cor pulmonale: Secondary | ICD-10-CM | POA: Diagnosis not present

## 2023-07-28 ENCOUNTER — Other Ambulatory Visit: Payer: Self-pay

## 2023-07-28 ENCOUNTER — Emergency Department (HOSPITAL_COMMUNITY)

## 2023-07-28 ENCOUNTER — Encounter (HOSPITAL_COMMUNITY): Payer: Self-pay

## 2023-07-28 ENCOUNTER — Emergency Department (HOSPITAL_COMMUNITY)
Admission: EM | Admit: 2023-07-28 | Discharge: 2023-07-28 | Disposition: A | Discharge status: Home / Self Care | Attending: Emergency Medicine | Admitting: Emergency Medicine

## 2023-07-28 DIAGNOSIS — I959 Hypotension, unspecified: Secondary | ICD-10-CM | POA: Diagnosis not present

## 2023-07-28 DIAGNOSIS — D649 Anemia, unspecified: Secondary | ICD-10-CM | POA: Insufficient documentation

## 2023-07-28 DIAGNOSIS — J9601 Acute respiratory failure with hypoxia: Secondary | ICD-10-CM | POA: Diagnosis not present

## 2023-07-28 DIAGNOSIS — I214 Non-ST elevation (NSTEMI) myocardial infarction: Secondary | ICD-10-CM | POA: Diagnosis not present

## 2023-07-28 DIAGNOSIS — E860 Dehydration: Secondary | ICD-10-CM | POA: Diagnosis not present

## 2023-07-28 DIAGNOSIS — Z7901 Long term (current) use of anticoagulants: Secondary | ICD-10-CM | POA: Diagnosis not present

## 2023-07-28 DIAGNOSIS — D72829 Elevated white blood cell count, unspecified: Secondary | ICD-10-CM | POA: Insufficient documentation

## 2023-07-28 DIAGNOSIS — M4802 Spinal stenosis, cervical region: Secondary | ICD-10-CM | POA: Diagnosis not present

## 2023-07-28 DIAGNOSIS — I517 Cardiomegaly: Secondary | ICD-10-CM | POA: Diagnosis not present

## 2023-07-28 DIAGNOSIS — R7989 Other specified abnormal findings of blood chemistry: Secondary | ICD-10-CM | POA: Diagnosis not present

## 2023-07-28 DIAGNOSIS — I2510 Atherosclerotic heart disease of native coronary artery without angina pectoris: Secondary | ICD-10-CM | POA: Diagnosis not present

## 2023-07-28 DIAGNOSIS — M96840 Postprocedural hematoma of a musculoskeletal structure following a musculoskeletal system procedure: Secondary | ICD-10-CM | POA: Diagnosis not present

## 2023-07-28 DIAGNOSIS — R42 Dizziness and giddiness: Secondary | ICD-10-CM | POA: Diagnosis not present

## 2023-07-28 DIAGNOSIS — R600 Localized edema: Secondary | ICD-10-CM | POA: Insufficient documentation

## 2023-07-28 DIAGNOSIS — M129 Arthropathy, unspecified: Secondary | ICD-10-CM | POA: Diagnosis not present

## 2023-07-28 DIAGNOSIS — R079 Chest pain, unspecified: Secondary | ICD-10-CM | POA: Insufficient documentation

## 2023-07-28 DIAGNOSIS — I129 Hypertensive chronic kidney disease with stage 1 through stage 4 chronic kidney disease, or unspecified chronic kidney disease: Secondary | ICD-10-CM | POA: Diagnosis not present

## 2023-07-28 DIAGNOSIS — D321 Benign neoplasm of spinal meninges: Secondary | ICD-10-CM | POA: Diagnosis not present

## 2023-07-28 DIAGNOSIS — I2693 Single subsegmental pulmonary embolism without acute cor pulmonale: Secondary | ICD-10-CM | POA: Diagnosis not present

## 2023-07-28 DIAGNOSIS — R55 Syncope and collapse: Secondary | ICD-10-CM | POA: Diagnosis not present

## 2023-07-28 LAB — URINALYSIS, MICROSCOPIC (REFLEX): RBC / HPF: >50 RBC/hpf (ref 0–5)

## 2023-07-28 LAB — BASIC METABOLIC PANEL WITH GFR
Anion gap: 11 (ref 5–15)
BUN: 20 mg/dL (ref 8–23)
CO2: 23 mmol/L (ref 22–32)
Calcium: 9.3 mg/dL (ref 8.9–10.3)
Chloride: 105 mmol/L (ref 98–111)
Creatinine, Ser: 1.56 mg/dL — ABNORMAL HIGH (ref 0.61–1.24)
GFR, Estimated: 48 mL/min — ABNORMAL LOW (ref >60)
Glucose, Bld: 132 mg/dL — ABNORMAL HIGH (ref 70–99)
Potassium: 4.3 mmol/L (ref 3.5–5.1)
Sodium: 139 mmol/L (ref 135–145)

## 2023-07-28 LAB — CBC
HCT: 29.9 % — ABNORMAL LOW (ref 39.0–52.0)
Hemoglobin: 9.3 g/dL — ABNORMAL LOW (ref 13.0–17.0)
MCH: 26.6 pg (ref 26.0–34.0)
MCHC: 31.1 g/dL (ref 30.0–36.0)
MCV: 85.4 fL (ref 80.0–100.0)
Platelets: 315 10*3/uL (ref 150–400)
RBC: 3.5 MIL/uL — ABNORMAL LOW (ref 4.22–5.81)
RDW: 15.3 % (ref 11.5–15.5)
WBC: 15 10*3/uL — ABNORMAL HIGH (ref 4.0–10.5)
nRBC: 0 % (ref 0.0–0.2)

## 2023-07-28 LAB — URINALYSIS, ROUTINE W REFLEX MICROSCOPIC
Glucose, UA: NEGATIVE mg/dL
Ketones, ur: NEGATIVE mg/dL
Leukocytes,Ua: NEGATIVE
Nitrite: NEGATIVE
Protein, ur: 100 mg/dL — AB
Specific Gravity, Urine: >1.03 — ABNORMAL HIGH (ref 1.005–1.030)
pH: 5.5 (ref 5.0–8.0)

## 2023-07-28 LAB — TROPONIN I (HIGH SENSITIVITY)
Troponin I (High Sensitivity): 30 ng/L — ABNORMAL HIGH (ref <18)
Troponin I (High Sensitivity): 28 ng/L — ABNORMAL HIGH (ref <18)

## 2023-07-28 MED ORDER — SODIUM CHLORIDE 0.9 % IV BOLUS
1000.0000 mL | Freq: Once | INTRAVENOUS | Status: AC
  Administered 2023-07-28: 1000 mL via INTRAVENOUS

## 2023-07-28 NOTE — ED Provider Notes (Signed)
 [place] EMERGENCY DEPARTMENT AT [facility] Provider Note   CSN: [id number] Arrival date & time: 07/28/23  1243     History  Chief Complaint  Patient presents with   Hypotension    Dizziness    Jeremiah Casey is a 68 y.o. male w/ hx of PE on xarelto, foley catheter, presenting by EMS with concern for low BP.  PT came to house and noted pt's BP low 70's systolic today; patient having dizziness at the time.  Complicated recent medical hospitalization at [facility], discharge summary as noted below:  "68 y/o male who presented to [facility] from inpatient rehab with an episode of right lower extremity numbness, weak pulses, hypotension, hypoxia, and obtunded state. He was at inpatient rehab recovering from C4-C7 laminoplasty to treat C4-C5 spinal stenosis and resection of extramedullary meningioma. Course complicated by NSTEMI however too high risk to undergo PCI and treated medically with high intensity heparin  infusion. Then unfortunately with epidural hematoma which was evacuated in OR with spinal hardware removal. On representation however he was transferred to MICU and responsive to fluids. Bedside POCUS concerning for PE which was confirmed on CTA. Started on heparin  infusion with tolerance and transferred out to regular floor. Transitioned to Xarelto without issue. Failed voiding trial and foley had to be replaced. Has been referred to Urology as outpatient. Surgica path has returned with grade 2 meningioma and he has been referred to Oncology. Evaluated by PT/OT and deemed improved enough to discharge home with wife and home health. He and family are comfortable with discharge plan and all questions addressed. "  HPI     Home Medications Prior to Admission medications   Medication Sig Start Date End Date Taking? Authorizing Provider  albuterol  (VENTOLIN  HFA) 108 (90 Base) MCG/ACT inhaler Inhale 2 puffs into the lungs every 4 (four) hours as needed for wheezing or shortness of  breath. 04/25/18   Jule Nyhan, MD  apixaban  (ELIQUIS ) 5 MG TABS tablet Take 1 tablet (5 mg total) by mouth 2 (two) times daily. Start after you finish the starter pack Eliquis . Patient not taking: Reported on 05/17/2023 04/29/23   Gonfa, Taye T, MD  atorvastatin  (LIPITOR) 80 MG tablet Take 80 mg by mouth daily.    [provider]  cetirizine  (ZYRTEC ) 10 MG tablet Take 1 tablet (10 mg total) by mouth 2 (two) times daily. 06/07/18   Trudy Fusi, DO  Continuous Glucose Sensor (FREESTYLE LIBRE 3 SENSOR) MISC as directed for 30 days    [provider]  doxazosin  (CARDURA ) 2 MG tablet  04/04/14   [provider]  EPINEPHrine  (AUVI-Q ) 0.3 mg/0.3 mL IJ SOAJ injection Use as directed for severe allergic reactions 04/25/18   Jule Nyhan, MD  famotidine  (PEPCID ) 20 MG tablet Take 1 tablet (20 mg total) by mouth 2 (two) times daily. Patient not taking: Reported on 05/17/2023 06/07/18   Trudy Fusi, DO  fluticasone  (FLONASE ) 50 MCG/ACT nasal spray 1-2 sprays daily for nasal congestion. 06/07/18   Trudy Fusi, DO  glucose blood (ONE TOUCH ULTRA TEST) test strip  06/14/14   [provider]  insulin  glargine (LANTUS  SOLOSTAR) 100 UNIT/ML Solostar Pen Inject 10 Units into the skin daily. Patient not taking: Reported on 05/17/2023 03/30/23   Gonfa, Taye T, MD  Insulin  Pen Needle (PEN NEEDLES) 30G X 5 MM MISC 1 Pen by Does not apply route daily. 03/30/23   Gonfa, Taye T, MD  metFORMIN (GLUCOPHAGE) 500 MG tablet Take  by mouth 2 (two) times daily with a meal.    [provider]  montelukast  (SINGULAIR ) 10 MG tablet Take 1 tablet once a day to prevent coughing or wheezing 12/06/18   Trudy Fusi, DO  rOPINIRole (REQUIP) 1 MG tablet Take 1 mg by mouth 2 (two) times daily. 03/19/23   [provider]  RYBELSUS 7 MG TABS Take 1 tablet by mouth daily. 04/12/23   [provider]  TRUEplus Lancets 33G MISC SMARTSIG:Lancet Topical 03/18/23   [provider]   valsartan (DIOVAN) 80 MG tablet Take 80 mg by mouth daily.    [provider]  Vitamin D, Ergocalciferol, (DRISDOL) 1.25 MG (50000 UNIT) CAPS capsule Take 50,000 Units by mouth once a week.    [provider]  XARELTO 20 MG TABS tablet Take 20 mg by mouth daily. 05/14/23   [provider]      Allergies    Patient has no known allergies.    Review of Systems   Review of Systems  Physical Exam Updated Vital Signs BP 126/82   Pulse 78   Temp 98.4 F (36.9 C) (Oral)   Resp 19   Ht 6\' 2"  (1.88 m)   Wt 108 kg   SpO2 (!) 89%   BMI 30.56 kg/m  Physical Exam Constitutional:      General: He is not in acute distress. HENT:     Head: Normocephalic and atraumatic.  Eyes:     Conjunctiva/sclera: Conjunctivae normal.     Pupils: Pupils are equal, round, and reactive to light.  Cardiovascular:     Rate and Rhythm: Normal rate and regular rhythm.  Pulmonary:     Effort: Pulmonary effort is normal. No respiratory distress.  Abdominal:     General: There is no distension.     Tenderness: There is no abdominal tenderness.  Genitourinary:    Comments: Indwelling foley draining yellow urine Musculoskeletal:     Right lower leg: Edema present.     Left lower leg: Edema present.  Skin:    General: Skin is warm and dry.  Neurological:     General: No focal deficit present.     Mental Status: He is alert. Mental status is at baseline.  Psychiatric:        Mood and Affect: Mood normal.        Behavior: Behavior normal.     ED Results / Procedures / Treatments   Labs (all labs ordered are listed, but only abnormal results are displayed) Labs Reviewed  BASIC METABOLIC PANEL WITH GFR - Abnormal; Notable for the following components:      Result Value   Glucose, Bld 132 (*)    Creatinine, Ser 1.56 (*)    GFR, Estimated 48 (*)    All other components within normal limits  CBC - Abnormal; Notable for the following components:   WBC 15.0 (*)    RBC 3.50  (*)    Hemoglobin 9.3 (*)    HCT 29.9 (*)    All other components within normal limits  URINALYSIS, ROUTINE W REFLEX MICROSCOPIC - Abnormal; Notable for the following components:   Color, Urine AMBER (*)    Specific Gravity, Urine >1.030 (*)    Hgb urine dipstick LARGE (*)    Bilirubin Urine SMALL (*)    Protein, ur 100 (*)    All other components within normal limits  URINALYSIS, MICROSCOPIC (REFLEX) - Abnormal; Notable for the following components:   Bacteria, UA RARE (*)  All other components within normal limits  TROPONIN I (HIGH SENSITIVITY) - Abnormal; Notable for the following components:   Troponin I (High Sensitivity) 30 (*)    All other components within normal limits  TROPONIN I (HIGH SENSITIVITY) - Abnormal; Notable for the following components:   Troponin I (High Sensitivity) 28 (*)    All other components within normal limits    EKG EKG Interpretation Date/Time:  Wednesday July 28 2023 12:55:12 EDT Ventricular Rate:  82 PR Interval:  168 QRS Duration:  109 QT Interval:  414 QTC Calculation: 484 R Axis:   -70  Text Interpretation: Sinus rhythm  Inverted T waves anterior leads Confirmed by Jerald Molly [phone]) on 07/28/2023 1:08:16 PM  Radiology DG Chest Portable 1 View Result Date: 07/28/2023 CLINICAL DATA:  Near syncope. EXAM: PORTABLE CHEST 1 VIEW COMPARISON:  Chest radiograph dated 03/29/2023. FINDINGS: No focal consolidation, pleural effusion or pneumothorax. Stable cardiac silhouette. No acute osseous pathology. Partially visualized cutaneous staples over the lower neck. IMPRESSION: No active disease. Electronically Signed   By: Angus Bark M.D.   On: 07/28/2023 13:54    Procedures Procedures    Medications Ordered in ED Medications  sodium chloride  0.9 % bolus 1,000 mL (1,000 mLs Intravenous New Bag/Given 07/28/23 1541)    ED Course/ Medical Decision Making/ A&P Clinical Course as of 07/28/23 1718  Wed Jul 28, 2023  1640 Received sign out  from Dr. Gordon Latus presenting with dizziness. Recent admit for NSTEMI. PT also on Xarelto for PE. Was working for physical therapy today and had episode of hypotension. Denies any symptoms currently. [WS]    Clinical Course User Index [WS] Mordecai Applebaum, MD                                 Medical Decision Making Amount and/or Complexity of Data Reviewed Labs: ordered. Radiology: ordered.   This patient presents to the ED with concern for low blood pressure. This involves an extensive number of treatment options, and is a complaint that carries with it a high risk of complications and morbidity.  The differential diagnosis includes dehydration versus orthostatic hypotension versus labile blood pressures from neurological disease versus infection versus other  Patient blood pressure is normal on arrival.  Fluctuating nature of blood pressure is less suggestive of cardiogenic shock from increasing clot burden on the heart.  He is not hypoxic or tachycardic.  He is also afebrile without any clear evidence of sepsis on arrival.  Of note, in addition to the PE, patient was also diagnosed with an NSTEMI at [facility] with trop 1500 on 07/23/23 at discharge.  According to the discharge summary is felt to not be a cardiac catheterization candidate due to his hemodynamic instability and was therefore managed with heparin  and medically in the hospital.  Co-morbidities that complicate the patient evaluation: History of recent hospitalization for PE, recent postoperative bleed, on Xarelto, and high risk of anemia and postoperative infection  Additional history obtained from EMS  External records from outside source obtained and reviewed including [facility] records and hospital discharge summary  I ordered and personally interpreted labs.  The pertinent results include: Hemoglobin of chronic anemia baseline, 9.3, delta troponin is very mildly elevated but flat, overall trending down from his last  hospitalization.  UA without evidence of infection.  BMP also unremarkable with creatinine near baseline levels 1.5.  White blood cell count does have elevation today, 15.0,  up from 10.0 yesterday  I ordered imaging studies including x-ray of the chest, CT of the chest I independently visualized and interpreted imaging which showed no acute abnormality noted on x-ray, CT of the chest pending at signout I agree with the radiologist interpretation  The patient was maintained on a cardiac monitor.  I personally viewed and interpreted the cardiac monitored which showed an underlying rhythm of: Sinus rhythm  Per my interpretation the patient's ECG shows sinus rhythm with nonspecific T wave inversions in the anterior leads  I ordered medication including IV fluids for orthostatic hypotension positive  I have reviewed the patients home medicines and have made adjustments as needed  Test Considered: Denies indication for repeat angiogram imaging of the chest.  Do not see evidence of new large clot burden, the patient is compliant on Xarelto, does not have any persistent hypoxia   Disposition:  The patient is signed out to Dr. Hiawatha Lout EDP at 4:45 pm pending follow up on CT imaging to complete possible infection workup.  This is due to his no leukocytosis.  The patient is otherwise well-appearing, has no abdominal or GI complaints to warrant CT imaging of the abdomen.  UA without evidence of infection.  I doubt sepsis.  His vital signs remained stable he can be discharged home in the company of his family.  His orthostatics were positive with a drop in his blood pressure.  These appear to be improving with fluids. He is 98% on room air at rest.          Final Clinical Impression(s) / ED Diagnoses Final diagnoses:  None    Rx / DC Orders ED Discharge Orders     None         Toni Demo, Janalyn Me, MD 07/28/23 1718

## 2023-07-28 NOTE — Discharge Instructions (Addendum)
 You were given IV fluids for low blood pressure in the ER today.  Please continue taking all the medications prescribed in the left the hospital yesterday, including this evening.

## 2023-07-28 NOTE — ED Provider Notes (Signed)
   ED Course / MDM   Clinical Course as of 07/28/23 1811  Wed Jul 28, 2023  1640 Received sign out from Dr. Gordon Latus presenting with dizziness. Recent admit for NSTEMI. PT also on Xarelto for PE. Was working for physical therapy today and had episode of hypotension. Denies any symptoms currently. [WS]  1810 Reassessed patient.  He says he feels much better after receiving IV fluids.  His laboratory testing is overall reassuring.  CT chest was obtained without signs of pneumonia.  Feel patient is stable for discharge. Recommended close primary care physician follow up and oral rehydration at home. Will discharge patient to home. All questions answered. Patient comfortable with plan of discharge. Return precautions discussed with patient and specified on the after visit summary.  [WS]    Clinical Course User Index [WS] Mordecai Applebaum, MD   Medical Decision Making Amount and/or Complexity of Data Reviewed Labs: ordered. Radiology: ordered.        Mordecai Applebaum, MD 07/28/23 [phone]

## 2023-07-28 NOTE — ED Triage Notes (Signed)
 PT BIB EMS from home, had a PE about a week ago, was discharged from [facility] 2 days ago, had been [place] for a hernia repair to the back of his neck, had been fine for 2 days and this morning while watching TV got dizzy and home aid checked his BP 70/?.  Negative stroke screen.  88/ Stood 70/ Currently 112/60 HR 86 Resp 16 CBG 156 96% RA

## 2023-07-30 DIAGNOSIS — J9601 Acute respiratory failure with hypoxia: Secondary | ICD-10-CM | POA: Diagnosis not present

## 2023-07-30 DIAGNOSIS — I214 Non-ST elevation (NSTEMI) myocardial infarction: Secondary | ICD-10-CM | POA: Diagnosis not present

## 2023-07-30 DIAGNOSIS — I2693 Single subsegmental pulmonary embolism without acute cor pulmonale: Secondary | ICD-10-CM | POA: Diagnosis not present

## 2023-07-30 DIAGNOSIS — D321 Benign neoplasm of spinal meninges: Secondary | ICD-10-CM | POA: Diagnosis not present

## 2023-07-30 DIAGNOSIS — I129 Hypertensive chronic kidney disease with stage 1 through stage 4 chronic kidney disease, or unspecified chronic kidney disease: Secondary | ICD-10-CM | POA: Diagnosis not present

## 2023-07-30 DIAGNOSIS — I2510 Atherosclerotic heart disease of native coronary artery without angina pectoris: Secondary | ICD-10-CM | POA: Diagnosis not present

## 2023-07-30 DIAGNOSIS — M96840 Postprocedural hematoma of a musculoskeletal structure following a musculoskeletal system procedure: Secondary | ICD-10-CM | POA: Diagnosis not present

## 2023-07-30 DIAGNOSIS — M4802 Spinal stenosis, cervical region: Secondary | ICD-10-CM | POA: Diagnosis not present

## 2023-07-30 DIAGNOSIS — I959 Hypotension, unspecified: Secondary | ICD-10-CM | POA: Diagnosis not present

## 2023-08-02 DIAGNOSIS — I959 Hypotension, unspecified: Secondary | ICD-10-CM | POA: Diagnosis not present

## 2023-08-02 DIAGNOSIS — I2510 Atherosclerotic heart disease of native coronary artery without angina pectoris: Secondary | ICD-10-CM | POA: Diagnosis not present

## 2023-08-02 DIAGNOSIS — D321 Benign neoplasm of spinal meninges: Secondary | ICD-10-CM | POA: Diagnosis not present

## 2023-08-02 DIAGNOSIS — M96840 Postprocedural hematoma of a musculoskeletal structure following a musculoskeletal system procedure: Secondary | ICD-10-CM | POA: Diagnosis not present

## 2023-08-02 DIAGNOSIS — J9601 Acute respiratory failure with hypoxia: Secondary | ICD-10-CM | POA: Diagnosis not present

## 2023-08-02 DIAGNOSIS — I129 Hypertensive chronic kidney disease with stage 1 through stage 4 chronic kidney disease, or unspecified chronic kidney disease: Secondary | ICD-10-CM | POA: Diagnosis not present

## 2023-08-02 DIAGNOSIS — I2693 Single subsegmental pulmonary embolism without acute cor pulmonale: Secondary | ICD-10-CM | POA: Diagnosis not present

## 2023-08-02 DIAGNOSIS — I214 Non-ST elevation (NSTEMI) myocardial infarction: Secondary | ICD-10-CM | POA: Diagnosis not present

## 2023-08-02 DIAGNOSIS — M4802 Spinal stenosis, cervical region: Secondary | ICD-10-CM | POA: Diagnosis not present

## 2023-08-03 DIAGNOSIS — Z4789 Encounter for other orthopedic aftercare: Secondary | ICD-10-CM | POA: Diagnosis not present

## 2023-08-04 DIAGNOSIS — E118 Type 2 diabetes mellitus with unspecified complications: Secondary | ICD-10-CM | POA: Diagnosis not present

## 2023-08-04 DIAGNOSIS — G4733 Obstructive sleep apnea (adult) (pediatric): Secondary | ICD-10-CM | POA: Diagnosis not present

## 2023-08-04 DIAGNOSIS — I2510 Atherosclerotic heart disease of native coronary artery without angina pectoris: Secondary | ICD-10-CM | POA: Diagnosis not present

## 2023-08-04 DIAGNOSIS — D497 Neoplasm of unspecified behavior of endocrine glands and other parts of nervous system: Secondary | ICD-10-CM | POA: Diagnosis not present

## 2023-08-04 DIAGNOSIS — I10 Essential (primary) hypertension: Secondary | ICD-10-CM | POA: Diagnosis not present

## 2023-08-04 DIAGNOSIS — I959 Hypotension, unspecified: Secondary | ICD-10-CM | POA: Diagnosis not present

## 2023-08-04 DIAGNOSIS — I2699 Other pulmonary embolism without acute cor pulmonale: Secondary | ICD-10-CM | POA: Diagnosis not present

## 2023-08-04 DIAGNOSIS — N1831 Chronic kidney disease, stage 3a: Secondary | ICD-10-CM | POA: Diagnosis not present

## 2023-08-04 DIAGNOSIS — R339 Retention of urine, unspecified: Secondary | ICD-10-CM | POA: Diagnosis not present

## 2023-08-05 DIAGNOSIS — M96840 Postprocedural hematoma of a musculoskeletal structure following a musculoskeletal system procedure: Secondary | ICD-10-CM | POA: Diagnosis not present

## 2023-08-05 DIAGNOSIS — M4802 Spinal stenosis, cervical region: Secondary | ICD-10-CM | POA: Diagnosis not present

## 2023-08-05 DIAGNOSIS — I2693 Single subsegmental pulmonary embolism without acute cor pulmonale: Secondary | ICD-10-CM | POA: Diagnosis not present

## 2023-08-05 DIAGNOSIS — D321 Benign neoplasm of spinal meninges: Secondary | ICD-10-CM | POA: Diagnosis not present

## 2023-08-05 DIAGNOSIS — I129 Hypertensive chronic kidney disease with stage 1 through stage 4 chronic kidney disease, or unspecified chronic kidney disease: Secondary | ICD-10-CM | POA: Diagnosis not present

## 2023-08-05 DIAGNOSIS — I959 Hypotension, unspecified: Secondary | ICD-10-CM | POA: Diagnosis not present

## 2023-08-05 DIAGNOSIS — J9601 Acute respiratory failure with hypoxia: Secondary | ICD-10-CM | POA: Diagnosis not present

## 2023-08-05 DIAGNOSIS — I214 Non-ST elevation (NSTEMI) myocardial infarction: Secondary | ICD-10-CM | POA: Diagnosis not present

## 2023-08-05 DIAGNOSIS — I2510 Atherosclerotic heart disease of native coronary artery without angina pectoris: Secondary | ICD-10-CM | POA: Diagnosis not present

## 2023-08-06 DIAGNOSIS — D321 Benign neoplasm of spinal meninges: Secondary | ICD-10-CM | POA: Diagnosis not present

## 2023-08-06 DIAGNOSIS — I959 Hypotension, unspecified: Secondary | ICD-10-CM | POA: Diagnosis not present

## 2023-08-06 DIAGNOSIS — M96840 Postprocedural hematoma of a musculoskeletal structure following a musculoskeletal system procedure: Secondary | ICD-10-CM | POA: Diagnosis not present

## 2023-08-06 DIAGNOSIS — I129 Hypertensive chronic kidney disease with stage 1 through stage 4 chronic kidney disease, or unspecified chronic kidney disease: Secondary | ICD-10-CM | POA: Diagnosis not present

## 2023-08-06 DIAGNOSIS — M4802 Spinal stenosis, cervical region: Secondary | ICD-10-CM | POA: Diagnosis not present

## 2023-08-06 DIAGNOSIS — J9601 Acute respiratory failure with hypoxia: Secondary | ICD-10-CM | POA: Diagnosis not present

## 2023-08-06 DIAGNOSIS — I2510 Atherosclerotic heart disease of native coronary artery without angina pectoris: Secondary | ICD-10-CM | POA: Diagnosis not present

## 2023-08-06 DIAGNOSIS — I2693 Single subsegmental pulmonary embolism without acute cor pulmonale: Secondary | ICD-10-CM | POA: Diagnosis not present

## 2023-08-06 DIAGNOSIS — I214 Non-ST elevation (NSTEMI) myocardial infarction: Secondary | ICD-10-CM | POA: Diagnosis not present

## 2023-08-07 DIAGNOSIS — R399 Unspecified symptoms and signs involving the genitourinary system: Secondary | ICD-10-CM | POA: Diagnosis not present

## 2023-08-10 DIAGNOSIS — D320 Benign neoplasm of cerebral meninges: Secondary | ICD-10-CM | POA: Diagnosis not present

## 2023-08-10 DIAGNOSIS — D321 Benign neoplasm of spinal meninges: Secondary | ICD-10-CM | POA: Diagnosis not present

## 2023-08-10 DIAGNOSIS — Z9889 Other specified postprocedural states: Secondary | ICD-10-CM | POA: Diagnosis not present

## 2023-08-11 DIAGNOSIS — E119 Type 2 diabetes mellitus without complications: Secondary | ICD-10-CM | POA: Diagnosis not present

## 2023-08-11 DIAGNOSIS — I214 Non-ST elevation (NSTEMI) myocardial infarction: Secondary | ICD-10-CM | POA: Diagnosis not present

## 2023-08-11 DIAGNOSIS — Z955 Presence of coronary angioplasty implant and graft: Secondary | ICD-10-CM | POA: Diagnosis not present

## 2023-08-11 DIAGNOSIS — I498 Other specified cardiac arrhythmias: Secondary | ICD-10-CM | POA: Diagnosis not present

## 2023-08-11 DIAGNOSIS — Z48811 Encounter for surgical aftercare following surgery on the nervous system: Secondary | ICD-10-CM | POA: Diagnosis not present

## 2023-08-11 DIAGNOSIS — I25118 Atherosclerotic heart disease of native coronary artery with other forms of angina pectoris: Secondary | ICD-10-CM | POA: Diagnosis not present

## 2023-08-11 DIAGNOSIS — E785 Hyperlipidemia, unspecified: Secondary | ICD-10-CM | POA: Diagnosis not present

## 2023-08-11 DIAGNOSIS — I2510 Atherosclerotic heart disease of native coronary artery without angina pectoris: Secondary | ICD-10-CM | POA: Diagnosis not present

## 2023-08-11 DIAGNOSIS — I444 Left anterior fascicular block: Secondary | ICD-10-CM | POA: Diagnosis not present

## 2023-08-11 DIAGNOSIS — I10 Essential (primary) hypertension: Secondary | ICD-10-CM | POA: Diagnosis not present

## 2023-08-11 DIAGNOSIS — G4730 Sleep apnea, unspecified: Secondary | ICD-10-CM | POA: Diagnosis not present

## 2023-08-12 DIAGNOSIS — I2693 Single subsegmental pulmonary embolism without acute cor pulmonale: Secondary | ICD-10-CM | POA: Diagnosis not present

## 2023-08-12 DIAGNOSIS — I959 Hypotension, unspecified: Secondary | ICD-10-CM | POA: Diagnosis not present

## 2023-08-12 DIAGNOSIS — M96840 Postprocedural hematoma of a musculoskeletal structure following a musculoskeletal system procedure: Secondary | ICD-10-CM | POA: Diagnosis not present

## 2023-08-12 DIAGNOSIS — I2510 Atherosclerotic heart disease of native coronary artery without angina pectoris: Secondary | ICD-10-CM | POA: Diagnosis not present

## 2023-08-12 DIAGNOSIS — I129 Hypertensive chronic kidney disease with stage 1 through stage 4 chronic kidney disease, or unspecified chronic kidney disease: Secondary | ICD-10-CM | POA: Diagnosis not present

## 2023-08-12 DIAGNOSIS — I214 Non-ST elevation (NSTEMI) myocardial infarction: Secondary | ICD-10-CM | POA: Diagnosis not present

## 2023-08-12 DIAGNOSIS — D321 Benign neoplasm of spinal meninges: Secondary | ICD-10-CM | POA: Diagnosis not present

## 2023-08-12 DIAGNOSIS — J9601 Acute respiratory failure with hypoxia: Secondary | ICD-10-CM | POA: Diagnosis not present

## 2023-08-12 DIAGNOSIS — M4802 Spinal stenosis, cervical region: Secondary | ICD-10-CM | POA: Diagnosis not present

## 2023-08-13 ENCOUNTER — Other Ambulatory Visit (HOSPITAL_BASED_OUTPATIENT_CLINIC_OR_DEPARTMENT_OTHER): Payer: Self-pay | Admitting: Family Medicine

## 2023-08-13 DIAGNOSIS — I2510 Atherosclerotic heart disease of native coronary artery without angina pectoris: Secondary | ICD-10-CM | POA: Diagnosis not present

## 2023-08-13 DIAGNOSIS — R600 Localized edema: Secondary | ICD-10-CM | POA: Diagnosis not present

## 2023-08-13 DIAGNOSIS — I10 Essential (primary) hypertension: Secondary | ICD-10-CM | POA: Diagnosis not present

## 2023-08-13 DIAGNOSIS — E118 Type 2 diabetes mellitus with unspecified complications: Secondary | ICD-10-CM | POA: Diagnosis not present

## 2023-08-13 DIAGNOSIS — D497 Neoplasm of unspecified behavior of endocrine glands and other parts of nervous system: Secondary | ICD-10-CM | POA: Diagnosis not present

## 2023-08-13 DIAGNOSIS — I82403 Acute embolism and thrombosis of unspecified deep veins of lower extremity, bilateral: Secondary | ICD-10-CM | POA: Diagnosis not present

## 2023-08-13 DIAGNOSIS — M545 Low back pain, unspecified: Secondary | ICD-10-CM | POA: Diagnosis not present

## 2023-08-13 DIAGNOSIS — N1831 Chronic kidney disease, stage 3a: Secondary | ICD-10-CM | POA: Diagnosis not present

## 2023-08-13 DIAGNOSIS — Z86711 Personal history of pulmonary embolism: Secondary | ICD-10-CM | POA: Diagnosis not present

## 2023-08-14 DIAGNOSIS — I959 Hypotension, unspecified: Secondary | ICD-10-CM | POA: Diagnosis not present

## 2023-08-14 DIAGNOSIS — I2693 Single subsegmental pulmonary embolism without acute cor pulmonale: Secondary | ICD-10-CM | POA: Diagnosis not present

## 2023-08-14 DIAGNOSIS — I2510 Atherosclerotic heart disease of native coronary artery without angina pectoris: Secondary | ICD-10-CM | POA: Diagnosis not present

## 2023-08-14 DIAGNOSIS — I129 Hypertensive chronic kidney disease with stage 1 through stage 4 chronic kidney disease, or unspecified chronic kidney disease: Secondary | ICD-10-CM | POA: Diagnosis not present

## 2023-08-14 DIAGNOSIS — D321 Benign neoplasm of spinal meninges: Secondary | ICD-10-CM | POA: Diagnosis not present

## 2023-08-14 DIAGNOSIS — M4802 Spinal stenosis, cervical region: Secondary | ICD-10-CM | POA: Diagnosis not present

## 2023-08-14 DIAGNOSIS — I214 Non-ST elevation (NSTEMI) myocardial infarction: Secondary | ICD-10-CM | POA: Diagnosis not present

## 2023-08-14 DIAGNOSIS — J9601 Acute respiratory failure with hypoxia: Secondary | ICD-10-CM | POA: Diagnosis not present

## 2023-08-14 DIAGNOSIS — M96840 Postprocedural hematoma of a musculoskeletal structure following a musculoskeletal system procedure: Secondary | ICD-10-CM | POA: Diagnosis not present

## 2023-08-16 ENCOUNTER — Ambulatory Visit (HOSPITAL_BASED_OUTPATIENT_CLINIC_OR_DEPARTMENT_OTHER)
Admission: RE | Admit: 2023-08-16 | Discharge: 2023-08-16 | Disposition: A | Discharge status: Home / Self Care | Source: Ambulatory Visit | Attending: Family Medicine | Admitting: Family Medicine

## 2023-08-16 DIAGNOSIS — I82451 Acute embolism and thrombosis of right peroneal vein: Secondary | ICD-10-CM | POA: Diagnosis not present

## 2023-08-16 DIAGNOSIS — R600 Localized edema: Secondary | ICD-10-CM | POA: Diagnosis not present

## 2023-08-16 DIAGNOSIS — R338 Other retention of urine: Secondary | ICD-10-CM | POA: Diagnosis not present

## 2023-08-17 DIAGNOSIS — I959 Hypotension, unspecified: Secondary | ICD-10-CM | POA: Diagnosis not present

## 2023-08-17 DIAGNOSIS — M4802 Spinal stenosis, cervical region: Secondary | ICD-10-CM | POA: Diagnosis not present

## 2023-08-17 DIAGNOSIS — I214 Non-ST elevation (NSTEMI) myocardial infarction: Secondary | ICD-10-CM | POA: Diagnosis not present

## 2023-08-17 DIAGNOSIS — M545 Low back pain, unspecified: Secondary | ICD-10-CM | POA: Diagnosis not present

## 2023-08-17 DIAGNOSIS — J9601 Acute respiratory failure with hypoxia: Secondary | ICD-10-CM | POA: Diagnosis not present

## 2023-08-17 DIAGNOSIS — I2510 Atherosclerotic heart disease of native coronary artery without angina pectoris: Secondary | ICD-10-CM | POA: Diagnosis not present

## 2023-08-17 DIAGNOSIS — I129 Hypertensive chronic kidney disease with stage 1 through stage 4 chronic kidney disease, or unspecified chronic kidney disease: Secondary | ICD-10-CM | POA: Diagnosis not present

## 2023-08-17 DIAGNOSIS — M96840 Postprocedural hematoma of a musculoskeletal structure following a musculoskeletal system procedure: Secondary | ICD-10-CM | POA: Diagnosis not present

## 2023-08-17 DIAGNOSIS — I2693 Single subsegmental pulmonary embolism without acute cor pulmonale: Secondary | ICD-10-CM | POA: Diagnosis not present

## 2023-08-17 DIAGNOSIS — D321 Benign neoplasm of spinal meninges: Secondary | ICD-10-CM | POA: Diagnosis not present

## 2023-08-19 DIAGNOSIS — I2693 Single subsegmental pulmonary embolism without acute cor pulmonale: Secondary | ICD-10-CM | POA: Diagnosis not present

## 2023-08-19 DIAGNOSIS — M4802 Spinal stenosis, cervical region: Secondary | ICD-10-CM | POA: Diagnosis not present

## 2023-08-19 DIAGNOSIS — I129 Hypertensive chronic kidney disease with stage 1 through stage 4 chronic kidney disease, or unspecified chronic kidney disease: Secondary | ICD-10-CM | POA: Diagnosis not present

## 2023-08-19 DIAGNOSIS — I2510 Atherosclerotic heart disease of native coronary artery without angina pectoris: Secondary | ICD-10-CM | POA: Diagnosis not present

## 2023-08-19 DIAGNOSIS — J9601 Acute respiratory failure with hypoxia: Secondary | ICD-10-CM | POA: Diagnosis not present

## 2023-08-19 DIAGNOSIS — I214 Non-ST elevation (NSTEMI) myocardial infarction: Secondary | ICD-10-CM | POA: Diagnosis not present

## 2023-08-19 DIAGNOSIS — M96840 Postprocedural hematoma of a musculoskeletal structure following a musculoskeletal system procedure: Secondary | ICD-10-CM | POA: Diagnosis not present

## 2023-08-19 DIAGNOSIS — D321 Benign neoplasm of spinal meninges: Secondary | ICD-10-CM | POA: Diagnosis not present

## 2023-08-19 DIAGNOSIS — I959 Hypotension, unspecified: Secondary | ICD-10-CM | POA: Diagnosis not present

## 2023-08-20 ENCOUNTER — Encounter (HOSPITAL_BASED_OUTPATIENT_CLINIC_OR_DEPARTMENT_OTHER): Payer: Self-pay

## 2023-08-20 ENCOUNTER — Other Ambulatory Visit: Payer: Self-pay

## 2023-08-20 DIAGNOSIS — Z7901 Long term (current) use of anticoagulants: Secondary | ICD-10-CM | POA: Insufficient documentation

## 2023-08-20 DIAGNOSIS — R310 Gross hematuria: Secondary | ICD-10-CM | POA: Insufficient documentation

## 2023-08-20 DIAGNOSIS — R319 Hematuria, unspecified: Secondary | ICD-10-CM | POA: Diagnosis present

## 2023-08-20 LAB — CBC WITH DIFFERENTIAL/PLATELET
Abs Immature Granulocytes: 0.03 10*3/uL (ref 0.00–0.07)
Basophils Absolute: 0.1 10*3/uL (ref 0.0–0.1)
Basophils Relative: 1 %
Eosinophils Absolute: 0.2 10*3/uL (ref 0.0–0.5)
Eosinophils Relative: 2 %
HCT: 32.4 % — ABNORMAL LOW (ref 39.0–52.0)
Hemoglobin: 10.4 g/dL — ABNORMAL LOW (ref 13.0–17.0)
Immature Granulocytes: 0 %
Lymphocytes Relative: 19 %
Lymphs Abs: 1.9 10*3/uL (ref 0.7–4.0)
MCH: 25.9 pg — ABNORMAL LOW (ref 26.0–34.0)
MCHC: 32.1 g/dL (ref 30.0–36.0)
MCV: 80.8 fL (ref 80.0–100.0)
Monocytes Absolute: 0.8 10*3/uL (ref 0.1–1.0)
Monocytes Relative: 8 %
Neutro Abs: 7.2 10*3/uL (ref 1.7–7.7)
Neutrophils Relative %: 70 %
Platelets: 345 10*3/uL (ref 150–400)
RBC: 4.01 MIL/uL — ABNORMAL LOW (ref 4.22–5.81)
RDW: 15.5 % (ref 11.5–15.5)
WBC: 10.1 10*3/uL (ref 4.0–10.5)
nRBC: 0 % (ref 0.0–0.2)

## 2023-08-20 LAB — BASIC METABOLIC PANEL WITH GFR
Anion gap: 14 (ref 5–15)
BUN: 20 mg/dL (ref 8–23)
CO2: 25 mmol/L (ref 22–32)
Calcium: 10.3 mg/dL (ref 8.9–10.3)
Chloride: 100 mmol/L (ref 98–111)
Creatinine, Ser: 1.21 mg/dL (ref 0.61–1.24)
GFR, Estimated: >60 mL/min (ref >60)
Glucose, Bld: 110 mg/dL — ABNORMAL HIGH (ref 70–99)
Potassium: 4.6 mmol/L (ref 3.5–5.1)
Sodium: 139 mmol/L (ref 135–145)

## 2023-08-20 LAB — CBG MONITORING, ED
Glucose-Capillary: 115 mg/dL — ABNORMAL HIGH (ref 70–99)
Glucose-Capillary: 117 mg/dL — ABNORMAL HIGH (ref 70–99)

## 2023-08-20 NOTE — ED Triage Notes (Signed)
 Pt reports currently on thinners for DVT in right leg.

## 2023-08-20 NOTE — ED Triage Notes (Signed)
 Pt coming in with indwelling foley catheter x1 month post spinal surgery to have tumor removed. (+) thinners. Noticed red tinge to blood in urine a few days ago. Urine bag changed on Tuesday, no UTI at this time. Pt also reports glucose has been in the 60s and 70s throughout the day. Pt states he feels normal otherwise.

## 2023-08-21 ENCOUNTER — Emergency Department (HOSPITAL_BASED_OUTPATIENT_CLINIC_OR_DEPARTMENT_OTHER)
Admission: EM | Admit: 2023-08-21 | Discharge: 2023-08-21 | Disposition: A | Discharge status: Home / Self Care | Attending: Emergency Medicine | Admitting: Emergency Medicine

## 2023-08-21 DIAGNOSIS — R310 Gross hematuria: Secondary | ICD-10-CM

## 2023-08-21 LAB — URINALYSIS, ROUTINE W REFLEX MICROSCOPIC
Bilirubin Urine: NEGATIVE
Glucose, UA: NEGATIVE mg/dL
Ketones, ur: NEGATIVE mg/dL
Nitrite: NEGATIVE
Protein, ur: NEGATIVE mg/dL
Specific Gravity, Urine: 1.015 (ref 1.005–1.030)
pH: 6 (ref 5.0–8.0)

## 2023-08-21 LAB — URINALYSIS, MICROSCOPIC (REFLEX): RBC / HPF: >50 RBC/hpf (ref 0–5)

## 2023-08-21 NOTE — ED Provider Notes (Signed)
 [place] EMERGENCY DEPARTMENT AT [facility]  Provider Note  CSN: [id number] Arrival date & time: 08/20/23 2107  History Chief Complaint  Patient presents with   Hematuria    Jeremiah Casey is a 68 y.o. male with history of chronic indwelling foley for about a month following spine surgery reports he had it exchanged at the urologists office 5 days ago and the sample was clear then. Today wife noticed some blood tinged urine in the bag. No clots. No fever, N/V or abdominal pain. He is on Eliquis  for DVT as well as ASA.    Home Medications Prior to Admission medications   Medication Sig Start Date End Date Taking? Authorizing Provider  albuterol  (VENTOLIN  HFA) 108 (90 Base) MCG/ACT inhaler Inhale 2 puffs into the lungs every 4 (four) hours as needed for wheezing or shortness of breath. 04/25/18   Asa Aloysius LABOR, MD  apixaban  (ELIQUIS ) 5 MG TABS tablet Take 1 tablet (5 mg total) by mouth 2 (two) times daily. Start after you finish the starter pack Eliquis . Patient not taking: Reported on 05/17/2023 04/29/23   Gonfa, Taye T, MD  atorvastatin  (LIPITOR) 80 MG tablet Take 80 mg by mouth daily.    [provider]  cetirizine  (ZYRTEC ) 10 MG tablet Take 1 tablet (10 mg total) by mouth 2 (two) times daily. 06/07/18   Luke Orlan HERO, DO  Continuous Glucose Sensor (FREESTYLE LIBRE 3 SENSOR) MISC as directed for 30 days    [provider]  doxazosin  (CARDURA ) 2 MG tablet  04/04/14   [provider]  EPINEPHrine  (AUVI-Q ) 0.3 mg/0.3 mL IJ SOAJ injection Use as directed for severe allergic reactions 04/25/18   Asa Aloysius LABOR, MD  famotidine  (PEPCID ) 20 MG tablet Take 1 tablet (20 mg total) by mouth 2 (two) times daily. Patient not taking: Reported on 05/17/2023 06/07/18   Luke Orlan HERO, DO  fluticasone  (FLONASE ) 50 MCG/ACT nasal spray 1-2 sprays daily for nasal congestion. 06/07/18   Luke Orlan HERO, DO  glucose blood (ONE TOUCH ULTRA TEST) test strip  06/14/14   [provider]  insulin  glargine (LANTUS  SOLOSTAR) 100 UNIT/ML Solostar Pen Inject 10 Units into the skin daily. Patient not taking: Reported on 05/17/2023 03/30/23   Gonfa, Taye T, MD  Insulin  Pen Needle (PEN NEEDLES) 30G X 5 MM MISC 1 Pen by Does not apply route daily. 03/30/23   Gonfa, Taye T, MD  metFORMIN (GLUCOPHAGE) 500 MG tablet Take by mouth 2 (two) times daily with a meal.    [provider]  montelukast  (SINGULAIR ) 10 MG tablet Take 1 tablet once a day to prevent coughing or wheezing 12/06/18   Luke Orlan HERO, DO  rOPINIRole (REQUIP) 1 MG tablet Take 1 mg by mouth 2 (two) times daily. 03/19/23   [provider]  RYBELSUS 7 MG TABS Take 1 tablet by mouth daily. 04/12/23   [provider]  TRUEplus Lancets 33G MISC SMARTSIG:Lancet Topical 03/18/23   [provider]  valsartan (DIOVAN) 80 MG tablet Take 80 mg by mouth daily.    [provider]  Vitamin D, Ergocalciferol, (DRISDOL) 1.25 MG (50000 UNIT) CAPS capsule Take 50,000 Units by mouth once a week.    [provider]  XARELTO 20 MG TABS tablet Take 20 mg by mouth daily. 05/14/23   [provider]     Allergies    Patient has no known allergies.   Review of Systems   Review of Systems Please see  HPI for pertinent positives and negatives  Physical Exam BP 131/72   Pulse 81   Temp 98.2 F (36.8 C) (Oral)   Resp 18   Ht 6' 2 (1.88 m)   Wt 104.3 kg   SpO2 97%   BMI 29.53 kg/m   Physical Exam Vitals and nursing note reviewed.  HENT:     Head: Normocephalic.     Nose: Nose normal.   Eyes:     Extraocular Movements: Extraocular movements intact.   Pulmonary:     Effort: Pulmonary effort is normal.  Abdominal:     Tenderness: There is no abdominal tenderness. There is no guarding.   Musculoskeletal:        General: Normal range of motion.     Cervical back: Neck supple.   Skin:    Findings: No rash (on exposed skin).   Neurological:     Mental Status: He is alert and  oriented to person, place, and time.   Psychiatric:        Mood and Affect: Mood normal.     ED Results / Procedures / Treatments   EKG None  Procedures Procedures  Medications Ordered in the ED Medications - No data to display  Initial Impression and Plan  Patient here for blood tinged urine in foley bag. No fevers or other symptoms of systemic infection. Labs in triage show unremarkable CBC and BMP. Awaiting UA to eval for signs of infection.   ED Course   Clinical Course as of 08/21/23 0304  Sat Aug 21, 2023  0302 UA with blood but no signs of infection. Patient advised to continue with oral hydration, follow up with Urology if urine does not clear in the next few days, RTED for any worsening bleeding, clots, pain, etc.  [CS]    Clinical Course User Index [CS] Roselyn Carlin NOVAK, MD     MDM Rules/Calculators/A&P Medical Decision Making Problems Addressed: Gross hematuria: acute illness or injury  Amount and/or Complexity of Data Reviewed Labs: ordered. Decision-making details documented in ED Course.     Final Clinical Impression(s) / ED Diagnoses Final diagnoses:  Gross hematuria    Rx / DC Orders ED Discharge Orders     None        Roselyn Carlin NOVAK, MD 08/21/23 [phone]

## 2023-08-24 DIAGNOSIS — L501 Idiopathic urticaria: Secondary | ICD-10-CM | POA: Diagnosis not present

## 2023-08-24 DIAGNOSIS — R338 Other retention of urine: Secondary | ICD-10-CM | POA: Diagnosis not present

## 2023-08-26 DIAGNOSIS — D321 Benign neoplasm of spinal meninges: Secondary | ICD-10-CM | POA: Diagnosis not present

## 2023-08-26 DIAGNOSIS — J9601 Acute respiratory failure with hypoxia: Secondary | ICD-10-CM | POA: Diagnosis not present

## 2023-08-26 DIAGNOSIS — I2693 Single subsegmental pulmonary embolism without acute cor pulmonale: Secondary | ICD-10-CM | POA: Diagnosis not present

## 2023-08-26 DIAGNOSIS — I2510 Atherosclerotic heart disease of native coronary artery without angina pectoris: Secondary | ICD-10-CM | POA: Diagnosis not present

## 2023-08-26 DIAGNOSIS — I129 Hypertensive chronic kidney disease with stage 1 through stage 4 chronic kidney disease, or unspecified chronic kidney disease: Secondary | ICD-10-CM | POA: Diagnosis not present

## 2023-08-26 DIAGNOSIS — I959 Hypotension, unspecified: Secondary | ICD-10-CM | POA: Diagnosis not present

## 2023-08-26 DIAGNOSIS — M4802 Spinal stenosis, cervical region: Secondary | ICD-10-CM | POA: Diagnosis not present

## 2023-08-26 DIAGNOSIS — M96840 Postprocedural hematoma of a musculoskeletal structure following a musculoskeletal system procedure: Secondary | ICD-10-CM | POA: Diagnosis not present

## 2023-08-26 DIAGNOSIS — I214 Non-ST elevation (NSTEMI) myocardial infarction: Secondary | ICD-10-CM | POA: Diagnosis not present

## 2023-08-30 DIAGNOSIS — D321 Benign neoplasm of spinal meninges: Secondary | ICD-10-CM | POA: Diagnosis not present

## 2023-08-30 DIAGNOSIS — I2693 Single subsegmental pulmonary embolism without acute cor pulmonale: Secondary | ICD-10-CM | POA: Diagnosis not present

## 2023-08-30 DIAGNOSIS — I129 Hypertensive chronic kidney disease with stage 1 through stage 4 chronic kidney disease, or unspecified chronic kidney disease: Secondary | ICD-10-CM | POA: Diagnosis not present

## 2023-08-30 DIAGNOSIS — Z9889 Other specified postprocedural states: Secondary | ICD-10-CM | POA: Diagnosis not present

## 2023-08-30 DIAGNOSIS — M47812 Spondylosis without myelopathy or radiculopathy, cervical region: Secondary | ICD-10-CM | POA: Diagnosis not present

## 2023-08-30 DIAGNOSIS — I959 Hypotension, unspecified: Secondary | ICD-10-CM | POA: Diagnosis not present

## 2023-08-30 DIAGNOSIS — J9601 Acute respiratory failure with hypoxia: Secondary | ICD-10-CM | POA: Diagnosis not present

## 2023-08-30 DIAGNOSIS — I2510 Atherosclerotic heart disease of native coronary artery without angina pectoris: Secondary | ICD-10-CM | POA: Diagnosis not present

## 2023-08-30 DIAGNOSIS — M4802 Spinal stenosis, cervical region: Secondary | ICD-10-CM | POA: Diagnosis not present

## 2023-08-30 DIAGNOSIS — M542 Cervicalgia: Secondary | ICD-10-CM | POA: Diagnosis not present

## 2023-08-30 DIAGNOSIS — M4312 Spondylolisthesis, cervical region: Secondary | ICD-10-CM | POA: Diagnosis not present

## 2023-08-30 DIAGNOSIS — M96840 Postprocedural hematoma of a musculoskeletal structure following a musculoskeletal system procedure: Secondary | ICD-10-CM | POA: Diagnosis not present

## 2023-08-30 DIAGNOSIS — I708 Atherosclerosis of other arteries: Secondary | ICD-10-CM | POA: Diagnosis not present

## 2023-08-30 DIAGNOSIS — I214 Non-ST elevation (NSTEMI) myocardial infarction: Secondary | ICD-10-CM | POA: Diagnosis not present

## 2023-08-31 DIAGNOSIS — M96840 Postprocedural hematoma of a musculoskeletal structure following a musculoskeletal system procedure: Secondary | ICD-10-CM | POA: Diagnosis not present

## 2023-08-31 DIAGNOSIS — D321 Benign neoplasm of spinal meninges: Secondary | ICD-10-CM | POA: Diagnosis not present

## 2023-08-31 DIAGNOSIS — I129 Hypertensive chronic kidney disease with stage 1 through stage 4 chronic kidney disease, or unspecified chronic kidney disease: Secondary | ICD-10-CM | POA: Diagnosis not present

## 2023-08-31 DIAGNOSIS — J9601 Acute respiratory failure with hypoxia: Secondary | ICD-10-CM | POA: Diagnosis not present

## 2023-08-31 DIAGNOSIS — I214 Non-ST elevation (NSTEMI) myocardial infarction: Secondary | ICD-10-CM | POA: Diagnosis not present

## 2023-08-31 DIAGNOSIS — I2693 Single subsegmental pulmonary embolism without acute cor pulmonale: Secondary | ICD-10-CM | POA: Diagnosis not present

## 2023-08-31 DIAGNOSIS — I2510 Atherosclerotic heart disease of native coronary artery without angina pectoris: Secondary | ICD-10-CM | POA: Diagnosis not present

## 2023-08-31 DIAGNOSIS — M4802 Spinal stenosis, cervical region: Secondary | ICD-10-CM | POA: Diagnosis not present

## 2023-08-31 DIAGNOSIS — I959 Hypotension, unspecified: Secondary | ICD-10-CM | POA: Diagnosis not present

## 2023-09-02 DIAGNOSIS — R338 Other retention of urine: Secondary | ICD-10-CM | POA: Diagnosis not present

## 2023-09-06 DIAGNOSIS — M96840 Postprocedural hematoma of a musculoskeletal structure following a musculoskeletal system procedure: Secondary | ICD-10-CM | POA: Diagnosis not present

## 2023-09-06 DIAGNOSIS — I214 Non-ST elevation (NSTEMI) myocardial infarction: Secondary | ICD-10-CM | POA: Diagnosis not present

## 2023-09-06 DIAGNOSIS — I129 Hypertensive chronic kidney disease with stage 1 through stage 4 chronic kidney disease, or unspecified chronic kidney disease: Secondary | ICD-10-CM | POA: Diagnosis not present

## 2023-09-06 DIAGNOSIS — I2693 Single subsegmental pulmonary embolism without acute cor pulmonale: Secondary | ICD-10-CM | POA: Diagnosis not present

## 2023-09-06 DIAGNOSIS — I2510 Atherosclerotic heart disease of native coronary artery without angina pectoris: Secondary | ICD-10-CM | POA: Diagnosis not present

## 2023-09-06 DIAGNOSIS — J9601 Acute respiratory failure with hypoxia: Secondary | ICD-10-CM | POA: Diagnosis not present

## 2023-09-06 DIAGNOSIS — D321 Benign neoplasm of spinal meninges: Secondary | ICD-10-CM | POA: Diagnosis not present

## 2023-09-06 DIAGNOSIS — M4802 Spinal stenosis, cervical region: Secondary | ICD-10-CM | POA: Diagnosis not present

## 2023-09-06 DIAGNOSIS — I959 Hypotension, unspecified: Secondary | ICD-10-CM | POA: Diagnosis not present

## 2023-09-10 DIAGNOSIS — N310 Uninhibited neuropathic bladder, not elsewhere classified: Secondary | ICD-10-CM | POA: Diagnosis not present

## 2023-09-10 DIAGNOSIS — R338 Other retention of urine: Secondary | ICD-10-CM | POA: Diagnosis not present

## 2023-09-13 DIAGNOSIS — N310 Uninhibited neuropathic bladder, not elsewhere classified: Secondary | ICD-10-CM | POA: Diagnosis not present

## 2023-09-13 DIAGNOSIS — R338 Other retention of urine: Secondary | ICD-10-CM | POA: Diagnosis not present

## 2023-09-14 DIAGNOSIS — I959 Hypotension, unspecified: Secondary | ICD-10-CM | POA: Diagnosis not present

## 2023-09-14 DIAGNOSIS — M4802 Spinal stenosis, cervical region: Secondary | ICD-10-CM | POA: Diagnosis not present

## 2023-09-14 DIAGNOSIS — I214 Non-ST elevation (NSTEMI) myocardial infarction: Secondary | ICD-10-CM | POA: Diagnosis not present

## 2023-09-14 DIAGNOSIS — M96840 Postprocedural hematoma of a musculoskeletal structure following a musculoskeletal system procedure: Secondary | ICD-10-CM | POA: Diagnosis not present

## 2023-09-14 DIAGNOSIS — I2510 Atherosclerotic heart disease of native coronary artery without angina pectoris: Secondary | ICD-10-CM | POA: Diagnosis not present

## 2023-09-14 DIAGNOSIS — I2693 Single subsegmental pulmonary embolism without acute cor pulmonale: Secondary | ICD-10-CM | POA: Diagnosis not present

## 2023-09-14 DIAGNOSIS — J9601 Acute respiratory failure with hypoxia: Secondary | ICD-10-CM | POA: Diagnosis not present

## 2023-09-14 DIAGNOSIS — D321 Benign neoplasm of spinal meninges: Secondary | ICD-10-CM | POA: Diagnosis not present

## 2023-09-14 DIAGNOSIS — I129 Hypertensive chronic kidney disease with stage 1 through stage 4 chronic kidney disease, or unspecified chronic kidney disease: Secondary | ICD-10-CM | POA: Diagnosis not present

## 2023-09-20 DIAGNOSIS — J9601 Acute respiratory failure with hypoxia: Secondary | ICD-10-CM | POA: Diagnosis not present

## 2023-09-20 DIAGNOSIS — I129 Hypertensive chronic kidney disease with stage 1 through stage 4 chronic kidney disease, or unspecified chronic kidney disease: Secondary | ICD-10-CM | POA: Diagnosis not present

## 2023-09-20 DIAGNOSIS — M4802 Spinal stenosis, cervical region: Secondary | ICD-10-CM | POA: Diagnosis not present

## 2023-09-20 DIAGNOSIS — I2510 Atherosclerotic heart disease of native coronary artery without angina pectoris: Secondary | ICD-10-CM | POA: Diagnosis not present

## 2023-09-20 DIAGNOSIS — I214 Non-ST elevation (NSTEMI) myocardial infarction: Secondary | ICD-10-CM | POA: Diagnosis not present

## 2023-09-20 DIAGNOSIS — I959 Hypotension, unspecified: Secondary | ICD-10-CM | POA: Diagnosis not present

## 2023-09-20 DIAGNOSIS — D321 Benign neoplasm of spinal meninges: Secondary | ICD-10-CM | POA: Diagnosis not present

## 2023-09-20 DIAGNOSIS — I2693 Single subsegmental pulmonary embolism without acute cor pulmonale: Secondary | ICD-10-CM | POA: Diagnosis not present

## 2023-09-20 DIAGNOSIS — M96840 Postprocedural hematoma of a musculoskeletal structure following a musculoskeletal system procedure: Secondary | ICD-10-CM | POA: Diagnosis not present

## 2023-09-22 DIAGNOSIS — I2510 Atherosclerotic heart disease of native coronary artery without angina pectoris: Secondary | ICD-10-CM | POA: Diagnosis not present

## 2023-09-22 DIAGNOSIS — J9601 Acute respiratory failure with hypoxia: Secondary | ICD-10-CM | POA: Diagnosis not present

## 2023-09-22 DIAGNOSIS — I214 Non-ST elevation (NSTEMI) myocardial infarction: Secondary | ICD-10-CM | POA: Diagnosis not present

## 2023-09-22 DIAGNOSIS — D321 Benign neoplasm of spinal meninges: Secondary | ICD-10-CM | POA: Diagnosis not present

## 2023-09-22 DIAGNOSIS — I129 Hypertensive chronic kidney disease with stage 1 through stage 4 chronic kidney disease, or unspecified chronic kidney disease: Secondary | ICD-10-CM | POA: Diagnosis not present

## 2023-09-22 DIAGNOSIS — M96840 Postprocedural hematoma of a musculoskeletal structure following a musculoskeletal system procedure: Secondary | ICD-10-CM | POA: Diagnosis not present

## 2023-09-22 DIAGNOSIS — I959 Hypotension, unspecified: Secondary | ICD-10-CM | POA: Diagnosis not present

## 2023-09-22 DIAGNOSIS — M4802 Spinal stenosis, cervical region: Secondary | ICD-10-CM | POA: Diagnosis not present

## 2023-09-22 DIAGNOSIS — I2693 Single subsegmental pulmonary embolism without acute cor pulmonale: Secondary | ICD-10-CM | POA: Diagnosis not present

## 2023-09-23 DIAGNOSIS — L501 Idiopathic urticaria: Secondary | ICD-10-CM | POA: Diagnosis not present
